# Patient Record
Sex: Female | Born: 2000 | Race: White | Hispanic: No | Marital: Single | Smoking: Never smoker
Health system: Southern US, Community
[De-identification: ages and names within clinical notes are randomized; demographics above are authoritative.]

## PROBLEM LIST (undated history)

## (undated) DIAGNOSIS — F419 Anxiety disorder, unspecified: Secondary | ICD-10-CM

## (undated) DIAGNOSIS — F32A Depression, unspecified: Secondary | ICD-10-CM

## (undated) HISTORY — DX: Depression, unspecified: F32.A

## (undated) HISTORY — DX: Anxiety disorder, unspecified: F41.9

## (undated) HISTORY — PX: OTHER SURGICAL HISTORY: SHX169

---
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
## 2020-01-26 ENCOUNTER — Other Ambulatory Visit: Payer: Self-pay

## 2020-01-26 ENCOUNTER — Encounter: Payer: Self-pay | Admitting: Physician Assistant

## 2020-01-26 ENCOUNTER — Ambulatory Visit (INDEPENDENT_AMBULATORY_CARE_PROVIDER_SITE_OTHER): Payer: 59 | Admitting: Physician Assistant

## 2020-01-26 ENCOUNTER — Ambulatory Visit (HOSPITAL_COMMUNITY)
Admission: RE | Admit: 2020-01-26 | Discharge: 2020-01-26 | Disposition: A | Discharge status: Home / Self Care | Payer: 59 | Source: Ambulatory Visit | Attending: Physician Assistant | Admitting: Physician Assistant

## 2020-01-26 VITALS — BP 90/60 | HR 62 | Temp 98.0°F | Ht 66.5 in | Wt 120.0 lb

## 2020-01-26 DIAGNOSIS — Z23 Encounter for immunization: Secondary | ICD-10-CM | POA: Diagnosis not present

## 2020-01-26 DIAGNOSIS — R1084 Generalized abdominal pain: Secondary | ICD-10-CM | POA: Diagnosis present

## 2020-01-26 LAB — CBC WITH DIFFERENTIAL/PLATELET
Basophils Relative: 0.5 %
Eosinophils Relative: 3.2 %
Hemoglobin: 13.5 g/dL (ref 11.7–15.5)
Lymphs Abs: 1864 cells/uL (ref 850–3900)
MCV: 96.9 fL (ref 80.0–100.0)
Platelets: 289 10*3/uL (ref 140–400)
RDW: 11.9 % (ref 11.0–15.0)
Total Lymphocyte: 32.7 %

## 2020-01-26 LAB — POC URINALSYSI DIPSTICK (AUTOMATED)
Bilirubin, UA: NEGATIVE
Blood, UA: 3
Glucose, UA: NEGATIVE
Ketones, UA: NEGATIVE
Leukocytes, UA: NEGATIVE
Nitrite, UA: NEGATIVE
Protein, UA: POSITIVE — AB
Spec Grav, UA: >=1.03 — AB (ref 1.010–1.025)
Urobilinogen, UA: 0.2 E.U./dL
pH, UA: 6 (ref 5.0–8.0)

## 2020-01-26 LAB — POCT URINE PREGNANCY: Preg Test, Ur: NEGATIVE

## 2020-01-26 MED ORDER — ESOMEPRAZOLE MAGNESIUM 40 MG PO CPDR: 40.0000 mg | DELAYED_RELEASE_CAPSULE | Freq: Every day | ORAL | 3 refills | Status: DC

## 2020-01-26 MED ORDER — ONDANSETRON HCL 4 MG PO TABS: 4.0000 mg | ORAL_TABLET | Freq: Three times a day (TID) | ORAL | 0 refills | Status: DC | PRN

## 2020-01-26 NOTE — Patient Instructions (Signed)
It was great to see you!  We are going to do some tests today to see what's going on. You could be dealing with a few things: gastritis (iritation of your stomach lining), irritable bowel syndrome, stomach ulcer, infection, or several other conditions. I will be in touch with your lab and urine results. We are also getting an ultrasound to see what's going on.  I have sent in a daily medication for you to start -- nexium. This will help reduce the acid content in your stomach and help with any gastritis (irritation of the stomach lining) that you may have.  I have also sent in an as needed medication for you -- zofran -- for nausea.  Please work on alcohol reduction.  Please stick to a bland diet (see below)     Parke Simmers Diet A bland diet consists of foods that are often soft and do not have a lot of fat, fiber, or extra seasonings. Foods without fat, fiber, or seasoning are easier for the body to digest. They are also less likely to irritate your mouth, throat, stomach, and other parts of your digestive system. A bland diet is sometimes called a BRAT diet. What is my plan? Your health care provider or food and nutrition specialist (dietitian) may recommend specific changes to your diet to prevent symptoms or to treat your symptoms. These changes may include:  Eating small meals often.  Cooking food until it is soft enough to chew easily.  Chewing your food well.  Drinking fluids slowly.  Not eating foods that are very spicy, sour, or fatty.  Not eating citrus fruits, such as oranges and grapefruit. What do I need to know about this diet?  Eat a variety of foods from the bland diet food list.  Do not follow a bland diet longer than needed.  Ask your health care provider whether you should take vitamins or supplements. What foods can I eat? Grains  Hot cereals, such as cream of wheat. Rice. Bread, crackers, or tortillas made from refined white flour. Vegetables Canned or  cooked vegetables. Mashed or boiled potatoes. Fruits  Bananas. Applesauce. Other types of cooked or canned fruit with the skin and seeds removed, such as canned peaches or pears. Meats and other proteins  Scrambled eggs. Creamy peanut butter or other nut butters. Lean, well-cooked meats, such as chicken or fish. Tofu. Soups or broths. Dairy Low-fat dairy products, such as milk, cottage cheese, or yogurt. Beverages  Water. Herbal tea. Apple juice. Fats and oils Mild salad dressings. Canola or olive oil. Sweets and desserts Pudding. Custard. Fruit gelatin. Ice cream. The items listed above may not be a complete list of recommended foods and beverages. Contact a dietitian for more options. What foods are not recommended? Grains Whole grain breads and cereals. Vegetables Raw vegetables. Fruits Raw fruits, especially citrus, berries, or dried fruits. Dairy Whole fat dairy foods. Beverages Caffeinated drinks. Alcohol. Seasonings and condiments Strongly flavored seasonings or condiments. Hot sauce. Salsa. Other foods Spicy foods. Fried foods. Sour foods, such as pickled or fermented foods. Foods with high sugar content. Foods high in fiber. The items listed above may not be a complete list of foods and beverages to avoid. Contact a dietitian for more information. Summary  A bland diet consists of foods that are often soft and do not have a lot of fat, fiber, or extra seasonings.  Foods without fat, fiber, or seasoning are easier for the body to digest.  Check with your health care  provider to see how long you should follow this diet plan. It is not meant to be followed for long periods. This information is not intended to replace advice given to you by your health care provider. Make sure you discuss any questions you have with your health care provider. Document Revised: 04/02/2017 Document Reviewed: 04/02/2017 Elsevier Patient Education  2020 [employer].   Take  care,  Jarold Motto PA-C

## 2020-01-26 NOTE — Addendum Note (Signed)
Addended by: Jimmye Norman on: 01/26/2020 10:20 AM   Modules accepted: Orders

## 2020-01-26 NOTE — Progress Notes (Signed)
Cassie [name] is a 19 y.o. female is here to establish care.  I acted as a Neurosurgeon for [employer], PA-C Corky Mull, LPN   History of Present Illness:   Chief Complaint  Patient presents with  . Establish Care  . 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 Would vomit to purge, denies laxative use. Feels like she is no longer suffering from her eating disorder. Pt c/o feeling nauseated all the time and vomiting everyday at least 2 x's a day for the past 2-3 months. She has not appetite and can go days without eating 1-2 days. She has cut meat out of diet. Having lower abdominal pain off and on. Pain is all over. She reports that she is not currently sexually active.  She is vegetarian. She binge drinks on the weekends and states that she went out for her sisters birthday last night, had greasy, fried foods last night from [place] around 3a. Used to use marijuana regularly, around 1 year ago. Denies concerns for cyclical vomiting syndrome while using regularly.  Endorses early satiety.  Denies rectal bleeding.  Has lost about 7 lb since September.  Wt Readings from Last 5 Encounters:  01/26/20 120 lb (54.4 kg)  07/06/14 112 lb 6.4 oz (51 kg) (48 %, Z= -0.04)*  08/08/11 90 lb 8 oz (41.1 kg) (51 %, Z= 0.03)*   * Growth percentiles are based on CDC (Girls, 2-20 Years) data.    Health Maintenance Due  Topic Date Due  . Hepatitis C Screening  Never done  . COVID-19 Vaccine (1) Never done  . CHLAMYDIA SCREENING  Never done  . HIV Screening  Never done  . TETANUS/TDAP  Never done  . INFLUENZA VACCINE  Never done    Past Medical History:  Diagnosis Date  . Anxiety   . Depression      Social History   Tobacco Use  . Smoking status: Never Smoker  . Smokeless tobacco: Never Used  Vaping Use  . Vaping Use: Former  Substance Use Topics  . Alcohol use: Yes    Alcohol/week: 5.0 standard drinks    Types: 5  Glasses of wine per week  . Drug use: Not Currently    Types: Marijuana    History reviewed. No pertinent surgical history.  Family History  Problem Relation Age of Onset  . Suicidality Father 20  . Depression Father   . Early death Father   . Alcohol abuse Father   . Other Maternal Grandfather 44       drown  . Alzheimer's disease Paternal Grandmother   . Suicidality Paternal Grandfather 44    PMHx, SurgHx, SocialHx, FamHx, Medications, and Allergies were reviewed in the Visit Navigator and updated as appropriate.   Patient Active Problem List   Diagnosis Date Noted  . Tension headache 07/06/2014  . Anxiety 07/06/2014    Social History   Tobacco Use  . Smoking status: Never Smoker  . Smokeless tobacco: Never Used  Vaping Use  . Vaping Use: Former  Substance Use Topics  . Alcohol use: Yes    Alcohol/week: 5.0 standard drinks    Types: 5 Glasses of wine per week  . Drug use: Not Currently    Types: Marijuana    Current Medications and Allergies:    Current Outpatient Medications:  .  albuterol (VENTOLIN HFA) 108 (90 Base) MCG/ACT inhaler, Inhale into the lungs., Disp: ,  Rfl:  .  ibuprofen (ADVIL,MOTRIN) 200 MG tablet, Take 200 mg by mouth every 6 (six) hours as needed. Taking two tablets as needed, Disp: , Rfl:  .  esomeprazole (NEXIUM) 40 MG capsule, Take 1 capsule (40 mg total) by mouth daily., Disp: 30 capsule, Rfl: 3 .  ondansetron (ZOFRAN) 4 MG tablet, Take 1 tablet (4 mg total) by mouth every 8 (eight) hours as needed for nausea or vomiting., Disp: 30 tablet, Rfl: 0  No Known Allergies  Review of Systems   ROS Negative unless otherwise specified per HPI.  Vitals:   Vitals:   01/26/20 0841  BP: 90/60  Pulse: 62  Temp: 98 F (36.7 C)  TempSrc: Temporal  SpO2: 98%  Weight: 120 lb (54.4 kg)  Height: 5' 6.5" (1.689 m)     Body mass index is 19.08 kg/m.   Physical Exam:    Physical Exam Vitals and nursing note reviewed.  Constitutional:        General: She is not in acute distress.    Appearance: She is well-developed. She is not ill-appearing or toxic-appearing.  Cardiovascular:     Rate and Rhythm: Normal rate and regular rhythm.     Pulses: Normal pulses.     Heart sounds: Normal heart sounds, S1 normal and S2 normal.     Comments: No LE edema Pulmonary:     Effort: Pulmonary effort is normal.     Breath sounds: Normal breath sounds.  Abdominal:     General: Abdomen is flat. Bowel sounds are normal.     Palpations: Abdomen is soft.     Tenderness: There is abdominal tenderness. There is no right CVA tenderness or left CVA tenderness.  Skin:    General: Skin is warm and dry.  Neurological:     Mental Status: She is alert.     GCS: GCS eye subscore is 4. GCS verbal subscore is 5. GCS motor subscore is 6.  Psychiatric:        Speech: Speech normal.        Behavior: Behavior normal. Behavior is cooperative.    Results for orders placed or performed in visit on 01/26/20  POCT urine pregnancy  Result Value Ref Range   Preg Test, Ur Negative Negative  POCT Urinalysis Dipstick (Automated)  Result Value Ref Range   Color, UA amber    Clarity, UA cloudy    Glucose, UA Negative Negative   Bilirubin, UA Negative    Ketones, UA Negative    Spec Grav, UA >=1.030 (A) 1.010 - 1.025   Blood, UA 3    pH, UA 6.0 5.0 - 8.0   Protein, UA Positive (A) Negative   Urobilinogen, UA 0.2 0.2 or 1.0 E.U./dL   Nitrite, UA Negative    Leukocytes, UA Negative Negative     Assessment and Plan:    Cassie [name] was seen today for establish care and nausea and vomiting.  Diagnoses and all orders for this visit:  Generalized abdominal pain She is quite tender on exam. DDx includes: gastritis, ulcer, IBS, constipation, H pylori, GERD, among others. UA negative (she is currently on her period.) UPT negative. Urine culture pending. Labs as ordered below. Will also order an abdominal u/s. Start daily nexium and as needed  zofran. Encouraged reduction in alcohol and working on bland diet. If labs and imaging unrevealing, will send GI referral. Worsening precautions advised. -     CBC with Differential/Platelet; Future -     Comprehensive metabolic panel; Future -  TSH; Future -     Lipase; Future -     Lipid panel; Future -     POCT urine pregnancy -     POCT Urinalysis Dipstick (Automated) -     Urine Culture; Future -     H. pylori breath test; Future -     US Abdomen Complete; Future -     Lipid panel -     Lipase -     Urine Culture -     TSH -     Comprehensive metabolic panel -     CBC with Differential/Platelet -     H. pylori breath test  Other orders -     esomeprazole (NEXIUM) 40 MG capsule; Take 1 capsule (40 mg total) by mouth daily. -     ondansetron (ZOFRAN) 4 MG tablet; Take 1 tablet (4 mg total) by mouth every 8 (eight) hours as needed for nausea or vomiting.    CMA or LPN served as scribe during this visit. History, Physical, and Plan performed by medical provider. The above documentation has been reviewed and is accurate and complete.   Jarold Motto, PA-C [facility] 01/26/2020  Follow-up: No follow-ups on file.

## 2020-01-27 LAB — CBC WITH DIFFERENTIAL/PLATELET
Absolute Monocytes: 764 cells/uL (ref 200–950)
Basophils Absolute: 29 cells/uL (ref 0–200)
Eosinophils Absolute: 182 cells/uL (ref 15–500)
HCT: 40.4 % (ref 35.0–45.0)
MCH: 32.4 pg (ref 27.0–33.0)
MCHC: 33.4 g/dL (ref 32.0–36.0)
MPV: 9.9 fL (ref 7.5–12.5)
Monocytes Relative: 13.4 %
Neutro Abs: 2861 cells/uL (ref 1500–7800)
Neutrophils Relative %: 50.2 %
RBC: 4.17 10*6/uL (ref 3.80–5.10)
WBC: 5.7 10*3/uL (ref 3.8–10.8)

## 2020-01-27 LAB — LIPID PANEL
Cholesterol: 143 mg/dL (ref <200)
HDL: 61 mg/dL (ref >=50)
LDL Cholesterol (Calc): 59 mg/dL (calc)
Non-HDL Cholesterol (Calc): 82 mg/dL (calc) (ref <130)
Total CHOL/HDL Ratio: 2.3 (calc) (ref <5.0)
Triglycerides: 150 mg/dL — ABNORMAL HIGH (ref <150)

## 2020-01-27 LAB — COMPREHENSIVE METABOLIC PANEL
AG Ratio: 1.8 (calc) (ref 1.0–2.5)
ALT: 10 U/L (ref 6–29)
AST: 13 U/L (ref 10–30)
Albumin: 4.4 g/dL (ref 3.6–5.1)
Alkaline phosphatase (APISO): 40 U/L (ref 31–125)
BUN: 12 mg/dL (ref 7–25)
CO2: 29 mmol/L (ref 20–32)
Calcium: 9.4 mg/dL (ref 8.6–10.2)
Chloride: 107 mmol/L (ref 98–110)
Creat: 0.92 mg/dL (ref 0.50–1.10)
Globulin: 2.4 g/dL (calc) (ref 1.9–3.7)
Glucose, Bld: 59 mg/dL — ABNORMAL LOW (ref 65–99)
Potassium: 4.1 mmol/L (ref 3.5–5.3)
Sodium: 143 mmol/L (ref 135–146)
Total Bilirubin: 0.3 mg/dL (ref 0.2–1.2)
Total Protein: 6.8 g/dL (ref 6.1–8.1)

## 2020-01-27 LAB — URINE CULTURE
MICRO NUMBER:: 11185594
SPECIMEN QUALITY:: ADEQUATE

## 2020-01-27 LAB — H. PYLORI BREATH TEST: H. pylori Breath Test: NOT DETECTED

## 2020-01-27 LAB — LIPASE: Lipase: 51 U/L (ref 7–60)

## 2020-01-27 LAB — TSH: TSH: 1.62 mIU/L

## 2020-01-28 ENCOUNTER — Other Ambulatory Visit: Payer: Self-pay

## 2020-01-28 DIAGNOSIS — R1084 Generalized abdominal pain: Secondary | ICD-10-CM

## 2020-03-07 ENCOUNTER — Encounter: Payer: Self-pay | Admitting: Physician Assistant

## 2020-03-30 ENCOUNTER — Ambulatory Visit (INDEPENDENT_AMBULATORY_CARE_PROVIDER_SITE_OTHER): Payer: 59 | Admitting: Physician Assistant

## 2020-03-30 ENCOUNTER — Encounter: Payer: Self-pay | Admitting: Physician Assistant

## 2020-03-30 VITALS — BP 94/52 | HR 68 | Ht 66.5 in | Wt 123.0 lb

## 2020-03-30 DIAGNOSIS — R112 Nausea with vomiting, unspecified: Secondary | ICD-10-CM

## 2020-03-30 DIAGNOSIS — R630 Anorexia: Secondary | ICD-10-CM

## 2020-03-30 DIAGNOSIS — R1084 Generalized abdominal pain: Secondary | ICD-10-CM | POA: Diagnosis not present

## 2020-03-30 NOTE — Progress Notes (Signed)
Chief Complaint: Abdominal pain, nausea and vomiting  HPI:    Cassie [name] is a 20 year old female with past medical history as listed below including anxiety and depression, who was referred to me by Jarold Motto, PA for a complaint of abdominal pain, nausea and vomiting.      01/26/2020 patient seen in clinic by her PCP for generalized abdominal pain, nausea and vomiting.  Described that she was bulimic from the ages of [age] and would vomit to purge.  At that time did not feel like she was suffering from her eating disorder.  She complained of feeling nauseated all the time and vomiting every day at least 2 times a day for the past 2 to 3 months.  She had no appetite and could go days without eating.  Also described lower abdominal pain off and on.  Describe binge drinking on the weekends.  Also describes using marijuana regularly around a year ago.  UA was negative.  She was started on daily Nexium 40 mg daily and as needed Zofran and an ultrasound was ordered.    01/26/2020 ultrasound of the abdomen with a contracted gallbladder probably due to inadequate fasting.  H. pylori breath testing was negative.  CBC and CMP as well as TSH normal.    Today, the patient presents to clinic and tells me that really she has been feeling better over the past 2 to 3 weeks with regaining her appetite and no further nausea or vomiting.  Occasionally she has some lower abdominal cramping but this is very brief and she is really not concerned about it.  Tells me that prior to this though when she saw her PCP and had the ultrasound everything was "terrible".  Explains that she had a decrease in appetite and did not feel like eating at all for 1 to 2 days.  Described constant nausea with some episodes of vomiting.  Tells me she has a very weak gag reflex which she thinks is related to her history of bulimia.  Also describes that she will have lower abdominal cramping which is kind of constant.  Bowel movements are also  very inconsistent.      Also describes that something "in my stomach pokes out", tells me this occurs in certain positions.  Does describe a history of anxiety and stress and feels like that may have been related to a spike in symptoms recently, but overall is just worried that she did damage to her GI symptom system back when she was bulimic.  Her inconsistent appetite worries her.    Denies fever, chills, weight loss, change in bowel habits, blood in her stool, family history of IBD or symptoms that awaken her from sleep  Past Medical History:  Diagnosis Date  . Anxiety   . Depression     No past surgical history on file.  Current Outpatient Medications  Medication Sig Dispense Refill  . albuterol (VENTOLIN HFA) 108 (90 Base) MCG/ACT inhaler Inhale into the lungs.    Marland Kitchen esomeprazole (NEXIUM) 40 MG capsule Take 1 capsule (40 mg total) by mouth daily. 30 capsule 3  . ibuprofen (ADVIL,MOTRIN) 200 MG tablet Take 200 mg by mouth every 6 (six) hours as needed. Taking two tablets as needed    . ondansetron (ZOFRAN) 4 MG tablet Take 1 tablet (4 mg total) by mouth every 8 (eight) hours as needed for nausea or vomiting. 30 tablet 0   No current facility-administered medications for this visit.    Allergies as  of 03/30/2020  . (No Known Allergies)    Family History  Problem Relation Age of Onset  . Suicidality Father 68  . Depression Father   . Early death Father   . Alcohol abuse Father   . Other Maternal Grandfather 44       drown  . Alzheimer's disease Paternal Grandmother   . Suicidality Paternal Grandfather 45    Social History   Socioeconomic History  . Marital status: Single    Spouse name: Not on file  . Number of children: Not on file  . Years of education: Not on file  . Highest education level: Not on file  Occupational History  . Not on file  Tobacco Use  . Smoking status: Never Smoker  . Smokeless tobacco: Never Used  Vaping Use  . Vaping Use: Former  Substance  and Sexual Activity  . Alcohol use: Yes    Alcohol/week: 5.0 standard drinks    Types: 5 Glasses of wine per week  . Drug use: Not Currently    Types: Marijuana  . Sexual activity: Yes    Birth control/protection: Pill  Other Topics Concern  . Not on file  Social History Narrative   [employer]   Lives with 3 roommmates   Social Determinants of Health   Financial Resource Strain: Not on file  Food Insecurity: Not on file  Transportation Needs: Not on file  Physical Activity: Not on file  Stress: Not on file  Social Connections: Not on file  Intimate Partner Violence: Not on file    Review of Systems:    Constitutional: No weight loss, fever or chills Skin: No rash  Cardiovascular: No chest pain  Respiratory: No SOB  Gastrointestinal: See HPI and otherwise negative Genitourinary: No dysuria  Neurological: No headache, dizziness or syncope Musculoskeletal: No new muscle or joint pain Hematologic: No bleeding  Psychiatric: No history of depression or anxiety   Physical Exam:  Vital signs: BP (!) 94/52   Pulse 68   Ht 5' 6.5" (1.689 m)   Wt 123 lb (55.8 kg)   SpO2 97%   BMI 19.56 kg/m   Constitutional:   Pleasant Caucasian female appears to be in NAD, Well developed, Well nourished, alert and cooperative Head:  Normocephalic and atraumatic. Eyes:   PEERL, EOMI. No icterus. Conjunctiva pink. Ears:  Normal auditory acuity. Neck:  Supple Throat: Oral cavity and pharynx without inflammation, swelling or lesion.  Respiratory: Respirations even and unlabored. Lungs clear to auscultation bilaterally.   No wheezes, crackles, or rhonchi.  Cardiovascular: Normal S1, S2. No MRG. Regular rate and rhythm. No peripheral edema, cyanosis or pallor.  Gastrointestinal:  Soft, nondistended, nontender. No rebound or guarding. Normal bowel sounds. No appreciable masses or hepatomegaly. Rectal:  Not performed.  Msk:  Symmetrical without gross deformities. Without edema, no deformity or joint  abnormality.  Neurologic:  Alert and  oriented x4;  grossly normal neurologically.  Skin:   Dry and intact without significant lesions or rashes. Psychiatric: Demonstrates good judgement and reason without abnormal affect or behaviors.  RELEVANT LABS AND IMAGING: CBC    Component Value Date/Time   WBC 5.7 01/26/2020 0912   RBC 4.17 01/26/2020 0912   HGB 13.5 01/26/2020 0912   HCT 40.4 01/26/2020 0912   PLT 289 01/26/2020 0912   MCV 96.9 01/26/2020 0912   MCH 32.4 01/26/2020 0912   MCHC 33.4 01/26/2020 0912   RDW 11.9 01/26/2020 0912   LYMPHSABS 1,864 01/26/2020 0912   EOSABS 182  01/26/2020 0912   BASOSABS 29 01/26/2020 0912    CMP     Component Value Date/Time   NA 143 01/26/2020 0912   K 4.1 01/26/2020 0912   CL 107 01/26/2020 0912   CO2 29 01/26/2020 0912   GLUCOSE 59 (L) 01/26/2020 0912   BUN 12 01/26/2020 0912   CREATININE 0.92 01/26/2020 0912   CALCIUM 9.4 01/26/2020 0912   PROT 6.8 01/26/2020 0912   AST 13 01/26/2020 0912   ALT 10 01/26/2020 0912   BILITOT 0.3 01/26/2020 0912    Assessment: 1.  Lower abdominal pain: Occasional lower abdominal pain; consider IBS 2.  Nausea and vomiting: Episodes of nausea and vomiting, patient reports easy gag reflex, history of bulimia likely contributing 3.  Decrease in appetite: With above 4.  History of bulimia: From the ages of [age]  Plan: 1.  Discussed with patient that the best way to make sure that nothing is organically going wrong in her GI system would be to do an EGD and colonoscopy.  We discussed these in detail and I answered her questions.  Patient is currently feeling better over the past couple of weeks and would really like to wait on these procedures but will call and let us know if she changes her mind. 2.  For now discussed that the symptoms sound most like irritable bowel syndrome +/- functional dyspepsia, we discussed how this is linked to her emotions and likely stress/anxiety. 3.  Patient will call and  let us know if she has further issues in the future.  She does live in [place] but tells me that she travels back occasionally to see her parents.  Assigned to Dr. Orvan Falconer this morning  Hyacinth Meeker, PA-C [facility] 03/30/2020, 9:44 AM  Cc: Jarold Motto, [place]

## 2020-03-30 NOTE — Patient Instructions (Signed)
If you are age 21 or older, your body mass index should be between 23-30. Your Body mass index is 19.56 kg/m. If this is out of the aforementioned range listed, please consider follow up with your Primary Care Provider.  If you are age 28 or younger, your body mass index should be between 19-25. Your Body mass index is 19.56 kg/m. If this is out of the aformentioned range listed, please consider follow up with your Primary Care Provider.   Follow up as needed.  Thank you for choosing me and  Gastroenterology.  Hyacinth Meeker, PA-C

## 2020-04-07 NOTE — Progress Notes (Signed)
Reviewed.  Archana Eckman L. Brynnleigh Mcelwee, MD, MPH  

## 2020-05-22 ENCOUNTER — Telehealth: Payer: Self-pay

## 2020-05-22 DIAGNOSIS — Z01419 Encounter for gynecological examination (general) (routine) without abnormal findings: Secondary | ICD-10-CM

## 2020-05-22 DIAGNOSIS — F32A Depression, unspecified: Secondary | ICD-10-CM | POA: Insufficient documentation

## 2020-05-22 NOTE — Telephone Encounter (Signed)
Pt is wanting a referral to an OBGYN at [facility] in [place]

## 2020-05-22 NOTE — Telephone Encounter (Signed)
Shanda Bumps, need to know provider name?

## 2020-05-22 NOTE — Telephone Encounter (Signed)
Referral has been placed. 

## 2020-05-22 NOTE — Telephone Encounter (Signed)
There was no specific provider requested. Just requesting that location

## 2020-11-15 ENCOUNTER — Other Ambulatory Visit: Payer: Self-pay

## 2020-11-15 ENCOUNTER — Encounter: Payer: Self-pay | Admitting: Physician Assistant

## 2020-11-15 ENCOUNTER — Ambulatory Visit (INDEPENDENT_AMBULATORY_CARE_PROVIDER_SITE_OTHER): Payer: 59 | Admitting: Physician Assistant

## 2020-11-15 VITALS — BP 90/60 | HR 60 | Temp 98.0°F | Ht 66.5 in | Wt 136.0 lb

## 2020-11-15 DIAGNOSIS — F32A Depression, unspecified: Secondary | ICD-10-CM | POA: Diagnosis not present

## 2020-11-15 DIAGNOSIS — R41840 Attention and concentration deficit: Secondary | ICD-10-CM

## 2020-11-15 DIAGNOSIS — R5383 Other fatigue: Secondary | ICD-10-CM | POA: Diagnosis not present

## 2020-11-15 DIAGNOSIS — R6881 Early satiety: Secondary | ICD-10-CM

## 2020-11-15 DIAGNOSIS — F419 Anxiety disorder, unspecified: Secondary | ICD-10-CM | POA: Diagnosis not present

## 2020-11-15 LAB — COMPREHENSIVE METABOLIC PANEL
ALT: 11 U/L (ref 0–35)
AST: 20 U/L (ref 0–37)
Albumin: 4.8 g/dL (ref 3.5–5.2)
Alkaline Phosphatase: 47 U/L (ref 39–117)
BUN: 19 mg/dL (ref 6–23)
CO2: 28 mEq/L (ref 19–32)
Calcium: 10.2 mg/dL (ref 8.4–10.5)
Chloride: 102 mEq/L (ref 96–112)
Creatinine, Ser: 1.04 mg/dL (ref 0.40–1.20)
GFR: 77.06 mL/min (ref >60.00)
Glucose, Bld: 81 mg/dL (ref 70–99)
Potassium: 4.4 mEq/L (ref 3.5–5.1)
Sodium: 139 mEq/L (ref 135–145)
Total Bilirubin: 0.7 mg/dL (ref 0.2–1.2)
Total Protein: 7.8 g/dL (ref 6.0–8.3)

## 2020-11-15 LAB — POC URINALSYSI DIPSTICK (AUTOMATED)
Bilirubin, UA: NEGATIVE
Blood, UA: POSITIVE
Glucose, UA: NEGATIVE
Ketones, UA: NEGATIVE
Leukocytes, UA: NEGATIVE
Nitrite, UA: NEGATIVE
Protein, UA: POSITIVE — AB
Spec Grav, UA: >=1.03 — AB (ref 1.010–1.025)
Urobilinogen, UA: 0.2 E.U./dL
pH, UA: 6 (ref 5.0–8.0)

## 2020-11-15 LAB — CBC WITH DIFFERENTIAL/PLATELET
Basophils Absolute: 0 10*3/uL (ref 0.0–0.1)
Basophils Relative: 0.3 % (ref 0.0–3.0)
Eosinophils Absolute: 0.1 10*3/uL (ref 0.0–0.7)
Eosinophils Relative: 1.3 % (ref 0.0–5.0)
HCT: 42.1 % (ref 36.0–46.0)
Hemoglobin: 14.1 g/dL (ref 12.0–15.0)
Lymphocytes Relative: 34.7 % (ref 12.0–46.0)
Lymphs Abs: 1.8 10*3/uL (ref 0.7–4.0)
MCHC: 33.5 g/dL (ref 30.0–36.0)
MCV: 94.3 fl (ref 78.0–100.0)
Monocytes Absolute: 0.5 10*3/uL (ref 0.1–1.0)
Monocytes Relative: 9.5 % (ref 3.0–12.0)
Neutro Abs: 2.9 10*3/uL (ref 1.4–7.7)
Neutrophils Relative %: 54.2 % (ref 43.0–77.0)
Platelets: 262 10*3/uL (ref 150.0–400.0)
RBC: 4.46 Mil/uL (ref 3.87–5.11)
RDW: 12.6 % (ref 11.5–15.5)
WBC: 5.3 10*3/uL (ref 4.0–10.5)

## 2020-11-15 LAB — VITAMIN D 25 HYDROXY (VIT D DEFICIENCY, FRACTURES): VITD: 46 ng/mL (ref 30.00–100.00)

## 2020-11-15 LAB — POCT URINE PREGNANCY: Preg Test, Ur: NEGATIVE

## 2020-11-15 LAB — B12 AND FOLATE PANEL
Folate: 24 ng/mL (ref >5.9)
Vitamin B-12: 286 pg/mL (ref 211–911)

## 2020-11-15 NOTE — Patient Instructions (Signed)
It was great to see you!  Please proceed with ADHD evaluation with the counselor  I will update your blood work today  If your symptoms are not improved, in regards to appetite, please come back and see me  Take care,  Jarold Motto PA-C

## 2020-11-15 NOTE — Progress Notes (Signed)
Cassie [name] is a 20 y.o. female here for a new problem.   History of Present Illness:   Chief Complaint  Patient presents with   Labs    Pt says that Ps[facility] sent order for anemia, and to check any vit deficiencies.    Problems Focusing    She states that she has 3 online classes.    HPI  Fatigue Has been dealing with decreased energy for a few months.  She has been seeing a psychiatrist for medication management of anxiety and depression.  She states that she is also being evaluated for possible bipolar disorder II, however no specific diagnosis has been made at this time.  She was put on Zoloft And this made her sleepy and more depressed.  She then states she was put on another medication that she cannot recall and also had a negative side effect from this medication.  As of 4 days ago she has been on Wellbutrin 150 mg daily. Having some irritability and insomnia with it. Taking at 10 am daily.  She reports that she has heavy periods that last about 7 days and the first 4 days are heavy.  She is getting about 8 hours of sleep per night.  She thinks that her uncontrolled anxiety and depression could be contributing to her fatigue.  She does admit that she does not eat well-balanced meals, she has issues with trying to eat frequently throughout the day.  She has early satiety.  Does have a history of bulimia, but states that she has not been actively purging and denies any current binging.  She has gained about 13 pounds since I saw her in January of this year.  Denies night sweats, shortness of breath, lower leg swelling, rectal bleeding.  Attention deficit She has some concerns that she may have ADHD.  She got a referral from her psychiatrist to see a psychologist but she is concerned because the weight is greater than 3 months to see this provider.  She states that her ADHD has caused her to not be able to focus during school, and she has had to drop out twice.  She is currently enrolled at  [employer].   Past Medical History:  Diagnosis Date   Anxiety    Depression      Social History   Tobacco Use   Smoking status: Never   Smokeless tobacco: Never  Vaping Use   Vaping Use: Former  Substance Use Topics   Alcohol use: Yes    Alcohol/week: 5.0 standard drinks    Types: 5 Glasses of wine per week   Drug use: Not Currently    Types: Marijuana    Past Surgical History:  Procedure Laterality Date   none      Family History  Problem Relation Age of Onset   Suicidality Father 23   Depression Father    Early death Father    Alcohol abuse Father    Other Maternal Grandfather 11       drown   Alzheimer's disease Paternal Grandmother    Suicidality Paternal Grandfather 6    No Known Allergies  Current Medications:   Current Outpatient Medications:    buPROPion (WELLBUTRIN XL) 150 MG 24 hr tablet, Take 150 mg by mouth every morning., Disp: , Rfl:    ibuprofen (ADVIL,MOTRIN) 200 MG tablet, Take 200 mg by mouth every 6 (six) hours as needed. Taking two tablets as needed, Disp: , Rfl:    Review of Systems:  ROS Negative unless otherwise specified per HPI.  Vitals:   Vitals:   11/15/20 0850  BP: 90/60  Pulse: 60  Temp: 98 F (36.7 C)  TempSrc: Temporal  SpO2: 99%  Weight: 136 lb (61.7 kg)  Height: 5' 6.5" (1.689 m)     Body mass index is 21.62 kg/m.  Physical Exam:   Physical Exam Vitals and nursing note reviewed.  Constitutional:      General: She is not in acute distress.    Appearance: She is well-developed. She is not ill-appearing or toxic-appearing.  Cardiovascular:     Rate and Rhythm: Normal rate and regular rhythm.     Pulses: Normal pulses.     Heart sounds: Normal heart sounds, S1 normal and S2 normal.     Comments: No LE edema Pulmonary:     Effort: Pulmonary effort is normal.     Breath sounds: Normal breath sounds.  Abdominal:     General: Abdomen is flat. Bowel sounds are normal.     Palpations: Abdomen is soft.      Tenderness: There is no abdominal tenderness.  Skin:    General: Skin is warm and dry.  Neurological:     Mental Status: She is alert.     GCS: GCS eye subscore is 4. GCS verbal subscore is 5. GCS motor subscore is 6.  Psychiatric:        Speech: Speech normal.        Behavior: Behavior normal. Behavior is cooperative.   Results for orders placed or performed in visit on 11/15/20  POCT Urinalysis Dipstick (Automated)  Result Value Ref Range   Color, UA yellow    Clarity, UA cloudy    Glucose, UA Negative Negative   Bilirubin, UA neg    Ketones, UA neg    Spec Grav, UA >=1.030 (A) 1.010 - 1.025   Blood, UA positive    pH, UA 6.0 5.0 - 8.0   Protein, UA Positive (A) Negative   Urobilinogen, UA 0.2 0.2 or 1.0 E.U./dL   Nitrite, UA neg    Leukocytes, UA Negative Negative  POCT urine pregnancy  Result Value Ref Range   Preg Test, Ur Negative Negative       Assessment and Plan:   1. Fatigue, unspecified type Suspect multifactorial We will update blood work and obtain urine pregnancy test to rule out organic causes of this I do suspect that her uncontrolled anxiety and depression is playing a role with this Continue to work on drinking fluids regularly and eating well-balanced meals as able Follow-up if symptoms persist  We will plan to fax her results to her psychiatrist: Jocelyn Lamer PA FAX: [phone]   2. Anxiety Uncontrolled, management per psychiatry.  3. Depression, unspecified depression type Uncontrolled, management per psychiatry. I discussed with patient that if they develop any SI, to tell someone immediately and seek medical attention.  4. Early satiety We will update blood work I do think this could be possibly related to her anxiety and also medication related. Continue to work on small frequent meals as able with plenty of hydration May potentially need to refer to GI for further evaluation if symptoms persist or worsen  5. Attention deficit I  discussed with patient that I cannot prescribe medication without proper diagnosis Encourage patient to follow-up with psych colleges that she was referred to her psychiatrist  CMA or LPN served as scribe during this visit. History, Physical, and Plan performed by medical provider. The above documentation has  been reviewed and is accurate and complete.  Jarold Motto, PA-C

## 2020-11-16 LAB — IRON,TIBC AND FERRITIN PANEL
%SAT: 29 % (calc) (ref 16–45)
Ferritin: 41 ng/mL (ref 16–154)
Iron: 95 ug/dL (ref 40–190)
TIBC: 327 mcg/dL (calc) (ref 250–450)

## 2020-11-28 ENCOUNTER — Telehealth: Payer: Self-pay

## 2020-11-28 DIAGNOSIS — F988 Other specified behavioral and emotional disorders with onset usually occurring in childhood and adolescence: Secondary | ICD-10-CM

## 2020-11-28 NOTE — Telephone Encounter (Signed)
Spoke to pt told her referral has been placed for [facility]. Pt verbalized understanding.

## 2020-11-28 NOTE — Telephone Encounter (Signed)
Patient is requesting Sam to enter a referral for ADD testing to [facility].    Please follow up in regard.

## 2021-02-01 ENCOUNTER — Telehealth (INDEPENDENT_AMBULATORY_CARE_PROVIDER_SITE_OTHER): Payer: 59 | Admitting: Family

## 2021-02-01 ENCOUNTER — Other Ambulatory Visit: Payer: Self-pay

## 2021-02-01 DIAGNOSIS — R41840 Attention and concentration deficit: Secondary | ICD-10-CM

## 2021-02-01 DIAGNOSIS — F324 Major depressive disorder, single episode, in partial remission: Secondary | ICD-10-CM

## 2021-02-01 DIAGNOSIS — F419 Anxiety disorder, unspecified: Secondary | ICD-10-CM | POA: Diagnosis not present

## 2021-02-01 MED ORDER — BUPROPION HCL ER (XL) 150 MG PO TB24: 150.0000 mg | ORAL_TABLET | Freq: Every morning | ORAL | 2 refills | Status: DC
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
## 2021-02-02 ENCOUNTER — Encounter: Payer: Self-pay | Admitting: Family

## 2021-02-19 ENCOUNTER — Encounter: Payer: Self-pay | Admitting: Family

## 2021-03-01 ENCOUNTER — Ambulatory Visit: Payer: 59 | Admitting: Family

## 2021-03-01 ENCOUNTER — Telehealth: Payer: Self-pay | Admitting: Family

## 2021-03-07 ENCOUNTER — Other Ambulatory Visit: Payer: Self-pay

## 2021-03-07 NOTE — Telephone Encounter (Signed)
Start off with taking half and may go up to full tab if any issues. Call in 2 weeks with update

## 2021-03-08 MED ORDER — DYANAVEL XR 5 MG PO CHER: 5.0000 mg | CHEWABLE_EXTENDED_RELEASE_TABLET | Freq: Every day | ORAL | 0 refills | Status: DC

## 2021-03-08 NOTE — Telephone Encounter (Signed)
Dyanavel XR 5 mg tablets daily, # 30 with no RF's.RX for above e-scribed and sent to pharmacy on record  [facility] [phone] [place] [facility], [place] - [address] [place] [address] Phone: [phone] Fax: [phone]

## 2021-03-09 ENCOUNTER — Telehealth: Payer: Self-pay

## 2021-03-14 ENCOUNTER — Other Ambulatory Visit: Payer: Self-pay

## 2021-03-14 MED ORDER — DYANAVEL XR 5 MG PO CHER: 5.0000 mg | CHEWABLE_EXTENDED_RELEASE_TABLET | Freq: Every day | ORAL | 0 refills | Status: DC

## 2021-03-14 NOTE — Telephone Encounter (Signed)
[facility] [address] [facility] around about when Dyanavel 5mg  will be in stock. [facility]

## 2021-03-14 NOTE — Telephone Encounter (Signed)
Dyanavel XR 5 mg daily, # 30 with no RF's.RX for above e-scribed and sent to pharmacy on record  [id number] - [place], [place] - [address] AT [facility] [address] [place] [address] Phone: [phone] Fax: [phone]

## 2021-03-20 DIAGNOSIS — R69 Illness, unspecified: Secondary | ICD-10-CM | POA: Diagnosis not present

## 2021-03-20 DIAGNOSIS — F411 Generalized anxiety disorder: Secondary | ICD-10-CM | POA: Diagnosis not present

## 2021-03-22 ENCOUNTER — Telehealth: Payer: Self-pay | Admitting: Family

## 2021-03-22 NOTE — Telephone Encounter (Signed)
error 

## 2021-03-29 ENCOUNTER — Ambulatory Visit: Payer: Self-pay | Admitting: Family

## 2021-04-05 ENCOUNTER — Other Ambulatory Visit: Payer: Self-pay

## 2021-04-05 NOTE — Telephone Encounter (Signed)
Patient called in stating that Dyanavel is working but feels she needs a higher dosage. Sending in 10mg 

## 2021-04-06 ENCOUNTER — Telehealth: Payer: Self-pay

## 2021-04-06 MED ORDER — DYANAVEL XR 10 MG PO CHER: 10.0000 mg | CHEWABLE_EXTENDED_RELEASE_TABLET | Freq: Every day | ORAL | 0 refills | Status: DC

## 2021-04-06 NOTE — Telephone Encounter (Signed)
RX for above e-scribed and sent to pharmacy on record  [id number] - [place], [place] - [address] [place] [address] Phone: [phone] Fax: [phone]    

## 2021-04-16 DIAGNOSIS — R52 Pain, unspecified: Secondary | ICD-10-CM | POA: Diagnosis not present

## 2021-04-16 DIAGNOSIS — Z3009 Encounter for other general counseling and advice on contraception: Secondary | ICD-10-CM | POA: Diagnosis not present

## 2021-04-16 DIAGNOSIS — Z01419 Encounter for gynecological examination (general) (routine) without abnormal findings: Secondary | ICD-10-CM | POA: Diagnosis not present

## 2021-04-16 DIAGNOSIS — Z1389 Encounter for screening for other disorder: Secondary | ICD-10-CM | POA: Diagnosis not present

## 2021-04-16 DIAGNOSIS — R69 Illness, unspecified: Secondary | ICD-10-CM | POA: Diagnosis not present

## 2021-04-16 DIAGNOSIS — Z113 Encounter for screening for infections with a predominantly sexual mode of transmission: Secondary | ICD-10-CM | POA: Diagnosis not present

## 2021-04-16 DIAGNOSIS — Z124 Encounter for screening for malignant neoplasm of cervix: Secondary | ICD-10-CM | POA: Diagnosis not present

## 2021-04-25 ENCOUNTER — Other Ambulatory Visit: Payer: Self-pay

## 2021-04-25 MED ORDER — DYANAVEL XR 20 MG PO CHER: 20.0000 mg | CHEWABLE_EXTENDED_RELEASE_TABLET | Freq: Every day | ORAL | 0 refills | Status: DC

## 2021-04-25 NOTE — Telephone Encounter (Signed)
Dyanavel XR 20 mg daily, # 30 with no RF's.RX for above e-scribed and sent to pharmacy on record  [facility] [phone] [facility], [place] - [address] [place] [address] Phone: [phone] Fax: [phone]

## 2021-04-26 ENCOUNTER — Telehealth: Payer: Self-pay

## 2021-05-01 ENCOUNTER — Encounter: Payer: Self-pay | Admitting: Family

## 2021-05-01 ENCOUNTER — Other Ambulatory Visit: Payer: Self-pay

## 2021-05-01 ENCOUNTER — Ambulatory Visit (INDEPENDENT_AMBULATORY_CARE_PROVIDER_SITE_OTHER): Payer: 59 | Admitting: Family

## 2021-05-01 VITALS — BP 112/64 | HR 72 | Ht 66.54 in | Wt 123.4 lb

## 2021-05-01 DIAGNOSIS — R4589 Other symptoms and signs involving emotional state: Secondary | ICD-10-CM

## 2021-05-01 DIAGNOSIS — Z719 Counseling, unspecified: Secondary | ICD-10-CM

## 2021-05-01 DIAGNOSIS — Z1339 Encounter for screening examination for other mental health and behavioral disorders: Secondary | ICD-10-CM

## 2021-05-01 DIAGNOSIS — R413 Other amnesia: Secondary | ICD-10-CM

## 2021-05-01 DIAGNOSIS — F419 Anxiety disorder, unspecified: Secondary | ICD-10-CM | POA: Diagnosis not present

## 2021-05-01 DIAGNOSIS — Z559 Problems related to education and literacy, unspecified: Secondary | ICD-10-CM

## 2021-05-01 DIAGNOSIS — R4189 Other symptoms and signs involving cognitive functions and awareness: Secondary | ICD-10-CM

## 2021-05-01 DIAGNOSIS — R41840 Attention and concentration deficit: Secondary | ICD-10-CM

## 2021-05-01 DIAGNOSIS — Z79899 Other long term (current) drug therapy: Secondary | ICD-10-CM

## 2021-05-01 DIAGNOSIS — F819 Developmental disorder of scholastic skills, unspecified: Secondary | ICD-10-CM | POA: Diagnosis not present

## 2021-05-01 DIAGNOSIS — Z7189 Other specified counseling: Secondary | ICD-10-CM

## 2021-05-01 DIAGNOSIS — R41844 Frontal lobe and executive function deficit: Secondary | ICD-10-CM

## 2021-05-01 MED ORDER — BUPROPION HCL ER (XL) 150 MG PO TB24: 150.0000 mg | ORAL_TABLET | Freq: Every morning | ORAL | 2 refills | Status: DC

## 2021-05-01 NOTE — Progress Notes (Deleted)
[facility] [address] [place] [address] Dept: [phone] Dept Fax: [phone] Loc: [phone] Loc Fax: [phone]  Neurodevelopmental Evaluation  Patient ID: Cassie [name], female  DOB: 12-15-2000, 21 y.o.  MRN: [id number]  DATE: 05/02/21  This is the first pediatric Neurodevelopmental Evaluation.  Patient is Polite and cooperative and present by herself.   The Intake interview was completed on 02/01/2021 .  Please review Epic for pertinent histories and review of Intake information.   The reason for the evaluation is to address concerns for Attention Deficit Hyperactivity Disorder (ADHD) or additional learning challenges.   Neurodevelopmental Examination:  Cassie [name] is a young adult, caucasian female who is alert, active and in no acute distress. She of average height with smaller build with no significant dysmorphic features noted.  Growth Parameters: Height: 66.54 inches Weight: 123.4 lb  OFC: 55.5 cm   BP: 112/64   General Exam: Physical Exam Vitals reviewed.  Constitutional:      Appearance: Normal appearance. She is well-developed.  HENT:     Head: Normocephalic and atraumatic.     Right Ear: Tympanic membrane, ear canal and external ear normal.     Left Ear: Tympanic membrane, ear canal and external ear normal.     Nose: Nose normal.     Mouth/Throat:     Mouth: Mucous membranes are moist.  Eyes:     Extraocular Movements: Extraocular movements intact.     Conjunctiva/sclera: Conjunctivae normal.     Pupils: Pupils are equal, round, and reactive to light.  Cardiovascular:     Rate and Rhythm: Normal rate and regular rhythm.     Pulses: Normal pulses.     Heart sounds: Normal heart sounds.  Pulmonary:     Effort: Pulmonary effort is normal.     Breath sounds: Normal breath sounds.  Abdominal:     General:  Bowel sounds are normal.     Palpations: Abdomen is soft.  Musculoskeletal:        General: Normal range of motion.     Cervical back: Normal range of motion and neck supple.  Skin:    General: Skin is warm and dry.     Capillary Refill: Capillary refill takes less than 2 seconds.  Neurological:     General: No focal deficit present.     Mental Status: She is alert and oriented to person, place, and time. Mental status is at baseline.     Deep Tendon Reflexes: Reflexes are normal and symmetric.  Psychiatric:        Mood and Affect: Mood normal.        Behavior: Behavior normal.        Thought Content: Thought content normal.        Judgment: Judgment normal.   Neurological: Language Sample: appropriate for age Oriented: oriented to time, place, and person Cranial Nerves: normal  Neuromuscular: Motor: muscle mass: normal  Strength: normal  Tone: normal Deep Tendon Reflexes: 2+ and symmetric Overflow/Reduplicative Beats: None Clonus: without  Babinskis: negative Primitive Reflex Profile: n/a  Cerebellar: no tremors noted  Sensory Exam: Fine touch: intact  Vibratory: intact  Gross Motor Skills: Walks, Runs, Up on Tip Toe, Jumps 24", Stands on 1 Foot (R), Stands on 1 Foot (L), Tandem (F), Tandem (R), and Skips Orthotic Devices: None  Developmental Examination: Developmental/Cognitive Testing: Gesell Figures: 12-year level, Goodenough Draw A Person: 13+-year level, Auditory Digits  D/F: 2 1/2-year level=3/3, 3-year level=3/3, 4 1/2-year level=3/3, 7-year level=3/3, 10-year level=2/3, Adult level=0/3, Auditory Digits D/R: 7-year level=3/3, 9-year level=1/3, 12-year level=0/3, Adult level=0/3, Visual/Oral D/F: adult level, Visual/Oral D/R: adult level, Auditory Sentences: 5-year, 44-month level, Reading: Oceanographer) Single Words: Kindergarten through 8th grade level=20/20, 9-12th grade level=18/20, Reading: Grade Level: high school level, and Other Comments: Right handed with a chuck pencil  grip. No difficulites with written output for the [employer] or sentence structure. Thoughts were clear and concise. Cassie [name] was able to produce appropriate sentences with capitalization and punctuation. She struggled to remember things such as audible objects, numbers, repeating back a sentence, and sequencing information. Cassie [name] asked for items to be repeated several times, which caused some visible annoyance. Cassie [name] did not struggle with copying pictures and had no difficulty with re-creating three-dimensional objects. She did improve with her memory skills when there was also visual input. Cassie [name] did struggle with attention often asking for items to be repeated, which caused an increase in her anxiety. She also had difficulty with some of the short term memory functions and auditory processing functions. Some display of fidgeting throughout the evaluation noted when Cassie [name] was required to focus or had difficulty with performance, which caused more anxiety. Cassie [name] would benefit from continued treatment of her anxiety and depression along with effective treatment of her attention issues.    Diagnoses:    ICD-10-CM   1. ADHD (attention deficit hyperactivity disorder) evaluation  Z13.39     2. Anxiety  F41.9     3. Depressed affect  R45.89     4. Learning difficulty  F81.9     5. Has difficulties with academic performance  Z55.9     6. Difficulty processing information  R41.89     7. Attention and concentration deficit  R41.840     8. Medication management  Z79.899     9. Patient counseled  Z71.9     10. Goals of care, counseling/discussion  Z71.89     11. Poor short term memory  R41.3     12. Executive function deficit  R41.844      Assessment:   Recommendations: ***

## 2021-05-02 ENCOUNTER — Telehealth: Payer: Self-pay

## 2021-05-02 ENCOUNTER — Encounter: Payer: Self-pay | Admitting: Family

## 2021-05-02 NOTE — Progress Notes (Signed)
[facility] [address] [place] [address] Dept: [phone] Dept Fax: [phone] Loc: [phone] Loc Fax: [phone]  Neurodevelopmental Evaluation  Patient ID: Cassie [name], female  DOB: May 22, 2000, 21 y.o.  MRN: [id number]  DATE: 05/01/21  This is the first pediatric Neurodevelopmental Evaluation.  Patient is Polite and cooperative and present by herself.   The Intake interview was completed on 02/01/2021 .  Please review Epic for pertinent histories and review of Intake information.   The reason for the evaluation is to address concerns for Attention Deficit Hyperactivity Disorder (ADHD) or additional learning challenges.   Neurodevelopmental Examination: Cassie [name] is a young adult female who was alert, active and in no acute distress. She is of average height with smaller frame with no dysmorphic features noted.  Growth Parameters: Height: 66.54 inches Weight: 123.4 lb   OFC: 55.5 cm   BP: 112/64   General Exam: Physical Exam Vitals reviewed.  Constitutional:      Appearance: Normal appearance. She is well-developed.  HENT:     Head: Normocephalic and atraumatic.     Right Ear: Tympanic membrane, ear canal and external ear normal.     Left Ear: Tympanic membrane, ear canal and external ear normal.     Nose: Nose normal.     Mouth/Throat:     Mouth: Mucous membranes are moist.  Eyes:     Extraocular Movements: Extraocular movements intact.     Conjunctiva/sclera: Conjunctivae normal.     Pupils: Pupils are equal, round, and reactive to light.  Cardiovascular:     Rate and Rhythm: Normal rate and regular rhythm.     Pulses: Normal pulses.     Heart sounds: Normal heart sounds.  Pulmonary:     Effort: Pulmonary effort is normal.     Breath sounds: Normal breath sounds.  Abdominal:     General: Bowel sounds are  normal.     Palpations: Abdomen is soft.  Musculoskeletal:        General: Normal range of motion.     Cervical back: Normal range of motion and neck supple.  Skin:    General: Skin is warm and dry.     Capillary Refill: Capillary refill takes less than 2 seconds.  Neurological:     General: No focal deficit present.     Mental Status: She is alert and oriented to person, place, and time. Mental status is at baseline.     Deep Tendon Reflexes: Reflexes are normal and symmetric.  Psychiatric:        Mood and Affect: Mood normal.        Behavior: Behavior normal.        Thought Content: Thought content normal.        Judgment: Judgment normal.   Neurological: Language Sample: appropriate for age Oriented: oriented to time, place, and person Cranial Nerves: normal  Neuromuscular: Motor: muscle mass: normal  Strength: normal  Tone: normal Deep Tendon Reflexes: 2+ and symmetric Overflow/Reduplicative Beats: None Clonus: without  Babinskis: negative Primitive Reflex Profile: n/a  Cerebellar: no tremors noted  Sensory Exam: Fine touch: intact  Vibratory: intact  Gross Motor Skills: Walks, Runs, Up on Tip Toe, Jumps 24", Stands on 1 Foot (R), Stands on 1 Foot (L), Tandem (F), Tandem (R), and Skips Orthotic Devices: None  Developmental Examination: Developmental/Cognitive Testing: Gesell Figures: 12-year level, Goodenough Draw A Person: 13+ year level, Auditory Digits  D/F: 2 1/2-year level=3/3, 3-year level=3/3, 4 1/2-year level=3/3, 7-year level=3/3, 10-year level=2/3, Adult level=0/3, Auditory Digits D/R: 7-year level=3/3, 9-year level=1/3, 12-year level=0/3, Adult level=0/3 Visual/Oral D/F: adult level, Visual/Oral D/R: adult level, Auditory Sentences: 5-year, 33-month level with no omissions or substitutions, Reading: Oceanographer) Single Words: Kindergarten through 8th grade level=20/20, 9-12th grade level=18/20, Grade Level: high school level, Reading: Paragraphs/Decoding: 100% with 25%  comprehension when she read the information versus 60% comprehension when the information was read to her by the provider, and Other Comments: Right handed  with a chuck pencil grip. No difficulites with written output for the [employer] or sentence structure. Thoughts were clear and concise. Cassie [name] was able to produce appropriate sentences with capitalization and punctuation. She struggled to remember things such as audible objects, numbers, repeating back a sentence, and sequencing information. Cassie [name] asked for items to be repeated several times, which caused some visible annoyance. Cassie [name] did not struggle with copying pictures and had no difficulty with re-creating three dimensional objects. She did improve with her memory skills when there was also visual input. Cassie [name] did struggle with attention often asking for items to be repeated, which caused an increase in her anxiety. She also had difficulty with some of the short term memory functions and auditory processing functions. Amahia would benefit from continued treatment of her anxiety and depression along with effective treatment of her attention issues.   Diagnoses:    ICD-10-CM   1. ADHD (attention deficit hyperactivity disorder) evaluation  Z13.39     2. Anxiety  F41.9     3. Depressed affect  R45.89     4. Learning difficulty  F81.9     5. Has difficulties with academic performance  Z55.9     6. Difficulty processing information  R41.89     7. Attention and concentration deficit  R41.840     8. Medication management  Z79.899     9. Patient counseled  Z71.9     10. Goals of care, counseling/discussion  Z71.89     11. Poor short term memory  R41.3     12. Executive function deficit  R41.844      Assessment: Cassie [name] is a 21 year old female with a history of anxiety, depression and difficulty with short-term memory. She has displayed problems with attention, processing information and executive functioning. She is  currently prescribed Wellbutrin XL 150 mg daily for treatment of her anxiety and depression with reported efficacy and no side effects. Still lacking executive functioning abilities due to inattention at home , school, and at work environments. Appropriate medication for management of her current symptoms will be reviewed with patient along with continued medication management for her anxiety and depression.   Recommendations:  Discussed today's evaluation with Cassie [name] and her ongoing concerns with attention issues and academic difficulties.   Reviewed rating scales for attention, anxiety and depression.   Concerns for daily performance with home, school, and work address with patient.    History of difficulties at school and academic performance along with ongoing issues with social interactions.   Discussed pharmacogenetic testing with patient related to appropriate medication management. Reviewed appropriate symptom control and current medication regiment.    Changes for medication adjustment over the next few weeks as follows: Increase Cassie [name] XR 10 mg chews to 20 mg daily due to needed symptom control in the evening time.   Had started medication prior to the evaluation with good results from Cassie [name] XR 10 mg chew with efficacy for during  the day but not for work in the evening time.   Wellbutrin XL 150 mg with no changes today due to continued symptom control.  Can email or call for updates over the next few weeks: [email or web address]   Counseled medication pharmacokinetics, options, dosage, administration, desired effects, and possible side effects.   Wellbutrin XL 150 mg daily, # 30 with 2 RF's Cassie [name] XR 20 mg daily 1/1 tablet BID, #30 with no RF's.Malena Peer for above e-scribed and sent to pharmacy on record  [facility] [phone], [place] - [address] [place] [address] Phone: [phone] Fax: [phone]  I discussed  the assessment and treatment plan with the patient.. The patient was provided an opportunity to ask questions and all were answered. The patient agreed with the plan and demonstrated an understanding of the instructions.   Recall Appointment: [place]   I provided 95 minutes of face-to-face time during this encounter and 20 minutes of counseling. Completed record review and laboratory work necessary for the visit during the evaluation.   The patient was advised to call back or seek an in-person evaluation if the symptoms worsen or if the condition fails to improve as anticipated.   Examiners: Carron Curie, NP

## 2021-05-03 ENCOUNTER — Other Ambulatory Visit: Payer: Self-pay

## 2021-05-03 MED ORDER — DYANAVEL XR 20 MG PO CHER: 20.0000 mg | CHEWABLE_EXTENDED_RELEASE_TABLET | Freq: Every day | ORAL | 0 refills | Status: DC

## 2021-05-03 NOTE — Telephone Encounter (Signed)
[facility] Patient is fine with med going to [facility]

## 2021-05-03 NOTE — Telephone Encounter (Signed)
Dyanavel XR tablets 20 mg 1/2 BID, # 30 with no RF's.RX for above e-scribed and sent to pharmacy on record  [facility] [place], [place] - [address] [place] [address] Phone: [phone] Fax: [phone]

## 2021-05-17 ENCOUNTER — Ambulatory Visit: Payer: 59 | Admitting: Family

## 2021-05-17 DIAGNOSIS — Z3009 Encounter for other general counseling and advice on contraception: Secondary | ICD-10-CM | POA: Diagnosis not present

## 2021-05-29 ENCOUNTER — Other Ambulatory Visit: Payer: Self-pay

## 2021-05-29 MED ORDER — DYANAVEL XR 20 MG PO CHER: 20.0000 mg | CHEWABLE_EXTENDED_RELEASE_TABLET | Freq: Every day | ORAL | 0 refills | Status: DC

## 2021-05-29 NOTE — Telephone Encounter (Signed)
Dyavavel XR 20 mg daily, # 30 with no RF's.RX for above e-scribed and sent to pharmacy on record ? ?[facility] - [place], [place] - [address] ?[address] ?[place] [address] ?Phone: [phone] Fax: [phone] ? ? ?

## 2021-06-08 ENCOUNTER — Other Ambulatory Visit: Payer: Self-pay

## 2021-06-08 MED ORDER — DYANAVEL XR 20 MG PO CHER: 20.0000 mg | CHEWABLE_EXTENDED_RELEASE_TABLET | Freq: Every day | ORAL | 0 refills | Status: DC

## 2021-06-08 NOTE — Telephone Encounter (Signed)
Dyanavel XR 20 mg tablets # 30 with no RF's. Post dated for 06/29/2021.RX for above e-scribed and sent to pharmacy on record ? ?[facility] - [place], [place] - [address] ?[address] ?[place] [address] ?Phone: [phone] Fax: [phone] ? ? ?

## 2021-06-21 ENCOUNTER — Other Ambulatory Visit: Payer: Self-pay

## 2021-06-21 ENCOUNTER — Telehealth: Payer: Self-pay

## 2021-06-21 MED ORDER — DYANAVEL XR 20 MG PO CHER: 20.0000 mg | CHEWABLE_EXTENDED_RELEASE_TABLET | Freq: Two times a day (BID) | ORAL | 0 refills | Status: DC

## 2021-07-09 DIAGNOSIS — Z3040 Encounter for surveillance of contraceptives, unspecified: Secondary | ICD-10-CM | POA: Diagnosis not present

## 2021-07-11 ENCOUNTER — Other Ambulatory Visit: Payer: Self-pay

## 2021-07-11 MED ORDER — DYANAVEL XR 10 MG PO CHER: 10.0000 mg | CHEWABLE_EXTENDED_RELEASE_TABLET | Freq: Every day | ORAL | 0 refills | Status: DC

## 2021-07-11 NOTE — Telephone Encounter (Signed)
Dyanavel XR 10 mg tablets # 30 with no RF's.RX for above e-scribed and sent to pharmacy on record ? ?[facility] - [place], [place] - [address] ?[address] ?[place] [address] ?Phone: [phone] Fax: [phone] ? ? ?

## 2021-07-12 ENCOUNTER — Telehealth: Payer: Self-pay | Admitting: Family

## 2021-07-12 MED ORDER — DYANAVEL XR 2.5 MG/ML PO SUER
10.0000 mL | Freq: Every day | ORAL | 0 refills | Status: DC
Start: 2021-07-12 — End: 2021-07-13

## 2021-07-12 NOTE — Telephone Encounter (Signed)
Discussed medication options with patient due to insurance coverage. Discussed options and will try Dyanavel liquid 10 mL daily, # 900 mL for 3 month supply, no RF's.RX for above e-scribed and sent to pharmacy on record ? ?[facility] - [place], [place] - [address] ?[address] ?[place] [address] ?Phone: [phone] Fax: [phone] ? ? ?

## 2021-07-13 ENCOUNTER — Telehealth: Payer: Self-pay

## 2021-07-13 ENCOUNTER — Other Ambulatory Visit: Payer: Self-pay

## 2021-07-13 MED ORDER — ADZENYS XR-ODT 9.4 MG PO TBED: 9.4000 mg | EXTENDED_RELEASE_TABLET | Freq: Every day | ORAL | 0 refills | Status: DC

## 2021-07-13 NOTE — Telephone Encounter (Signed)
Adzenys 9.4 mg daily # 30 with no RF's.RX for above e-scribed and sent to pharmacy on record ? ?[facility] - [place], [place] - [address] ?[address] ?[place] [address] ?Phone: [phone] Fax: [phone] ? ? ?

## 2021-07-13 NOTE — Telephone Encounter (Signed)
Denied.

## 2021-07-16 NOTE — Telephone Encounter (Signed)
Outcome ?Deniedon April 28 ?Your PA request has been denied. Additional information will be provided in the denial communication. (Message [phone]) ?

## 2021-07-20 ENCOUNTER — Telehealth: Payer: Self-pay | Admitting: Family

## 2021-07-20 MED ORDER — ADZENYS XR-ODT 18.8 MG PO TBED: 18.8000 mg | EXTENDED_RELEASE_TABLET | Freq: Every day | ORAL | 0 refills | Status: DC

## 2021-07-20 NOTE — Telephone Encounter (Signed)
Adzenys 18.8 mg daily, # 30 with no RF's.RX for above e-scribed and sent to pharmacy on record ? ?[facility] - [place], [place] - [address] ?[address] ?[place] [address] ?Phone: [phone] Fax: [phone] ? ? ?

## 2021-07-23 ENCOUNTER — Telehealth: Payer: Self-pay

## 2021-07-24 ENCOUNTER — Telehealth (INDEPENDENT_AMBULATORY_CARE_PROVIDER_SITE_OTHER): Payer: 59 | Admitting: Family

## 2021-07-24 ENCOUNTER — Encounter: Payer: Self-pay | Admitting: Family

## 2021-07-24 DIAGNOSIS — Z8659 Personal history of other mental and behavioral disorders: Secondary | ICD-10-CM

## 2021-07-24 DIAGNOSIS — Z79899 Other long term (current) drug therapy: Secondary | ICD-10-CM | POA: Diagnosis not present

## 2021-07-24 DIAGNOSIS — Z8489 Family history of other specified conditions: Secondary | ICD-10-CM

## 2021-07-24 DIAGNOSIS — Z719 Counseling, unspecified: Secondary | ICD-10-CM | POA: Diagnosis not present

## 2021-07-24 DIAGNOSIS — F900 Attention-deficit hyperactivity disorder, predominantly inattentive type: Secondary | ICD-10-CM | POA: Diagnosis not present

## 2021-07-24 DIAGNOSIS — R69 Illness, unspecified: Secondary | ICD-10-CM | POA: Diagnosis not present

## 2021-07-24 DIAGNOSIS — Z7189 Other specified counseling: Secondary | ICD-10-CM | POA: Diagnosis not present

## 2021-07-24 DIAGNOSIS — F419 Anxiety disorder, unspecified: Secondary | ICD-10-CM

## 2021-07-24 MED ORDER — BUPROPION HCL 75 MG PO TABS: 75.0000 mg | ORAL_TABLET | Freq: Every day | ORAL | 1 refills | Status: DC

## 2021-07-24 NOTE — Progress Notes (Signed)
?[place] ?[facility] ?[place] ?[place] [address] ?Dept: [phone] ?Dept Fax: [phone] ? ?Medication Check visit via Virtual Video  ? ?Patient ID:  Cassie [name]  female DOB: May 09, 2000   21 y.o.   MRN: [id number]  ? ?DATE:07/24/21 ? ?PCP: Cassie Coke, PA ? ?Virtual Visit via Video Note ? ?I connected with  Cassie [name] on 07/24/21 at  3:00 PM EDT by a video enabled telemedicine application and verified that I am speaking with the correct person using two identifiers. Patient/Parent Location: at home ?  ?I discussed the limitations, risks, security and privacy concerns of performing an evaluation and management service by telephone and the availability of in person appointments. I also discussed with the parents that there may be a patient responsible charge related to this service. The parents expressed understanding and agreed to proceed. ? ?Provider: Carolann Littler, NP  Location: work location ? ?HPI/CURRENT STATUS: ?Cassie [name] is here for medication management of the psychoactive medications for ADHD and review of educational and behavioral concerns.  ? ?Analey currently taking Adzenys 18.8 mg daily, which is working well. Takes medication daily in the morning. Medication tends to wear off around several hours after taking her medications. Cassie [name] is able to focus through school, homework & work.   ? ?Cassie [name] is eating well (eating breakfast, lunch and dinner). Cassie [name] does not have appetite suppression and no concerns. ? ?Sleeping well (getting enough sleep), sleeping through the night. Cassie [name] does not have delayed sleep onset and grinding her teeth recently.  ? ?EDUCATION/WORK: ?School: [employer] ?Summer session for 2 classes ?Transfer student in the fall ?Year/Grade:  college   ?Performance/ Grades: above average ?Services: Other: disability services  are available at school. ?Work: [employer] ?Evening shifts for the during the week ?Day into evening time ? ?Activities/ Exercise: intermittently with work ? ?MEDICAL HISTORY: ?Individual Medical History/ Review of Systems: None  Has been healthy with no visits to the PCP. [place] due yearly.  ? ?Family Medical/ Social History: Changes? None reported ?Patient Lives with:  sister and her dog ? ?MENTAL HEALTH: ?Mental Health Issues: Depression and Anxiety better controlled with current medications and wanting to lower her dose. ? ?Allergies: ?No Known Allergies ? ?Current Medications:  ?Current Outpatient Medications  ?Medication Instructions  ? Adzenys XR-ODT 18.8 mg, Oral, Daily  ? buPROPion (WELLBUTRIN) 75 mg, Oral, Daily  ? etonogestrel-ethinyl estradiol (NUVARING) 0.12-0.015 MG/24HR vaginal ring SMARTSIG:1 ring Vaginal Once a Month  ? ibuprofen (ADVIL) 200 mg, Every 6 hours PRN  ? Multiple Vitamins-Minerals (MULTIVITAMIN ADULT) CHEW Oral  ? propranolol (INDERAL) 10 mg, As needed  ? ?Medication Side Effects: None ? ?DIAGNOSES:  ?  ICD-10-CM   ?1. ADHD (attention deficit hyperactivity disorder), inattentive type  F90.0   ?  ?2. Anxiety  F41.9   ?  ?3. History of depression  Z86.59   ?  ?4. Family history of headaches  Z84.89   ?  ?5. Medication management  Z79.899   ?  ?6. Goals of care, counseling/discussion  Z71.89   ?  ?7. Patient counseled  Z71.9   ?  ? ?ASSESSMENT:     ?[name] is a 21 year old female with a history of ADHD, Anxiety and Depression. Patient has been maintained on Wellbutrin XL 150 mg daily along with recently starting on Adzenys 18.8 mg for a dose adjustment. Good efficacy for the past 2 days and no side effects. Academically  doing well at [employer] with transferring to [employer] next year. No disability services in place right now. Working several hours in the evenings and weekends. 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 Eating well with no changes. Getting some activity with work and serving. No sleep issues reported, but grinding her  teeth. Wanting to discontinue her Wellbutrin. Not currently in counseling. Will continue with the Adzenys at 18.8 mg and discuss discontinuation of the Wellbutrin.  ? ?PLAN/RECOMMENDATIONS:  ?Updates for school, progress and transferring to [employer] for the fall semester. ? ?Academic major and assistance with disability services is available for learning support at school. ? ?Work and duties with hours that change depending on the day of the week. Supported continued progression at work with her responsibilities.  ? ?Activity with dog and continued movement with work. Suggested outside activity when possible for a healthy lifestyle. ? ?Eating well with no issues and getting a variety of foods daily.  ?Suggested a MVI daily to supplement her dietary intake. ? ?Sleeping well with no issues reported and no current concerns with maintenance. Grinding her teeth at night and suggested contacting dentist for a night guard.  ? ?Discussed Wellbutrin with discontinuation and how to slowly lower dose over the next few weeks. Will call with any concerns or issues with lower dose.  ? ?Counseled medication pharmacokinetics, options, dosage, administration, desired effects, and possible side effects.   ?Adzenys 18.8 mg daily, no Rx today ?Decreased Wellbutrin to 75 mg daily, # 30 with 1 RF's ?RX for above e-scribed and sent to pharmacy on record [facility] [place] ?[place], [place]. ? ?I discussed the assessment and treatment plan with the patient. The patient was provided an opportunity to ask questions and all were answered. The patient agreed with the plan and demonstrated an understanding of the instructions. ?  ?NEXT APPOINTMENT:  ?10/30/2021-f/u visit  ?Telehealth OK ? ?The patient was advised to call back or seek an in-person evaluation if the symptoms worsen or if the condition fails to improve as anticipated. ? ? ?Carolann Littler, NP ? ?

## 2021-07-25 ENCOUNTER — Encounter: Payer: Self-pay | Admitting: Family

## 2021-08-07 ENCOUNTER — Telehealth: Payer: 59 | Admitting: Family

## 2021-08-08 ENCOUNTER — Telehealth: Payer: Self-pay

## 2021-08-08 ENCOUNTER — Telehealth: Payer: Self-pay | Admitting: Family

## 2021-08-08 MED ORDER — ADZENYS XR-ODT 18.8 MG PO TBED: 37.6000 mg | EXTENDED_RELEASE_TABLET | Freq: Every day | ORAL | 0 refills | Status: DC

## 2021-08-08 NOTE — Telephone Encounter (Signed)
Patient requested increase to 2 tablets daily of Adzenys 18.8 mg daily, # 60 with no RF's.RX for above e-scribed and sent to pharmacy on record  [facility] [place], [place] - [address] [place] [address] Phone: [phone] Fax: [phone]

## 2021-08-20 DIAGNOSIS — B379 Candidiasis, unspecified: Secondary | ICD-10-CM | POA: Diagnosis not present

## 2021-08-20 DIAGNOSIS — B3731 Acute candidiasis of vulva and vagina: Secondary | ICD-10-CM | POA: Diagnosis not present

## 2021-08-20 DIAGNOSIS — L293 Anogenital pruritus, unspecified: Secondary | ICD-10-CM | POA: Diagnosis not present

## 2021-08-20 DIAGNOSIS — N898 Other specified noninflammatory disorders of vagina: Secondary | ICD-10-CM | POA: Diagnosis not present

## 2021-08-20 DIAGNOSIS — Z3040 Encounter for surveillance of contraceptives, unspecified: Secondary | ICD-10-CM | POA: Diagnosis not present

## 2021-08-24 NOTE — Telephone Encounter (Signed)
Denied.

## 2021-08-28 ENCOUNTER — Encounter: Payer: Self-pay | Admitting: Family

## 2021-08-28 ENCOUNTER — Telehealth (INDEPENDENT_AMBULATORY_CARE_PROVIDER_SITE_OTHER): Payer: 59 | Admitting: Family

## 2021-08-28 ENCOUNTER — Encounter: Payer: 59 | Admitting: Family

## 2021-08-28 DIAGNOSIS — F32A Depression, unspecified: Secondary | ICD-10-CM | POA: Diagnosis not present

## 2021-08-28 DIAGNOSIS — R5383 Other fatigue: Secondary | ICD-10-CM | POA: Diagnosis not present

## 2021-08-28 DIAGNOSIS — F900 Attention-deficit hyperactivity disorder, predominantly inattentive type: Secondary | ICD-10-CM | POA: Diagnosis not present

## 2021-08-28 DIAGNOSIS — Z8659 Personal history of other mental and behavioral disorders: Secondary | ICD-10-CM | POA: Diagnosis not present

## 2021-08-28 DIAGNOSIS — Z789 Other specified health status: Secondary | ICD-10-CM | POA: Diagnosis not present

## 2021-08-28 DIAGNOSIS — R630 Anorexia: Secondary | ICD-10-CM

## 2021-08-28 DIAGNOSIS — Z7689 Persons encountering health services in other specified circumstances: Secondary | ICD-10-CM | POA: Diagnosis not present

## 2021-08-28 DIAGNOSIS — Z719 Counseling, unspecified: Secondary | ICD-10-CM | POA: Diagnosis not present

## 2021-08-28 DIAGNOSIS — Z91148 Patient's other noncompliance with medication regimen for other reason: Secondary | ICD-10-CM | POA: Diagnosis not present

## 2021-08-28 DIAGNOSIS — F419 Anxiety disorder, unspecified: Secondary | ICD-10-CM

## 2021-08-28 DIAGNOSIS — Z7189 Other specified counseling: Secondary | ICD-10-CM | POA: Diagnosis not present

## 2021-08-28 DIAGNOSIS — R69 Illness, unspecified: Secondary | ICD-10-CM | POA: Diagnosis not present

## 2021-08-28 MED ORDER — LISDEXAMFETAMINE DIMESYLATE 30 MG PO CAPS: 30.0000 mg | ORAL_CAPSULE | Freq: Every day | ORAL | 0 refills | Status: DC

## 2021-08-28 NOTE — Progress Notes (Signed)
[facility] [address], [place] [place] [address] Dept: [phone] Dept Fax: [phone]  Medication Check visit via Virtual Video   Patient ID:  Cassie [name]  female DOB: [birth year]   21 y.o.   MRN: [id number]   DATE:08/28/21  PCP: Jarold Motto, PA  Virtual Visit via Video Note  I connected with  Jenne Campus on 08/28/21 at  1:00 PM EDT by a video enabled telemedicine application and verified that I am speaking with the correct person using two identifiers. Patient/Parent Location: At home  I discussed the limitations, risks, security and privacy concerns of performing an evaluation and management service by telephone and the availability of in person appointments. I also discussed with the parents that there may be a patient responsible charge related to this service. The parents expressed understanding and agreed to proceed.  Provider: Carron Curie, NP  Location: work location  HPI/CURRENT STATUS: [name] is here for medication management of the psychoactive medications for ADHD and review of educational and behavioral concerns.   Samah currently taking Adzenys 18.8 mg 2-3 tablets daily, which is working well. Takes medication in the morning and at night with her Adzenys.. Medication tends to wear off around late evening. Kamyah is able to focus through school and work with some homework. Recently stopped taking her Wellbutrin (2 weeks ago) on her own.   Braylea is eating well (eating breakfast, lunch and dinner). Orlene does have appetite suppression and attempting to eating more protein and changed her diet to vegetarian recently.   Sleeping well (getting about 8 hours now, but with school was only getting 5 hours), sleeping through the night. Clifford has some delayed sleep onset due to work and homework schedule.   EDUCATION/WORK: School: [employer] now with summer  classes and transferring to [employer] Year/Grade:  college   Performance/ Grades: average Services: Other: none Work:  Water quality scientist Exercise: intermittently  MEDICAL HISTORY: Individual Medical History/ Review of Systems: None reported recently.  Has been healthy with no visits to the PCP. [facility] due yearly.   Family Medical/ Social History: None Patient Lives with:  sister  and dog.   MENTAL HEALTH: Mental Health Issues:   Depression and Anxiety-still havng symptoms and stopped her Wellbutrin on her own 2 weeks ago.  Allergies: No Known Allergies  Current Medications:  Current Outpatient Medications on File Prior to Visit  Medication Sig Dispense Refill   Amphetamine ER (ADZENYS XR-ODT) 18.8 MG TBED Take 37.6 mg by mouth daily. 60 tablet 0   buPROPion (WELLBUTRIN) 75 MG tablet Take 1 tablet (75 mg total) by mouth daily. 30 tablet 1   etonogestrel-ethinyl estradiol (NUVARING) 0.12-0.015 MG/24HR vaginal ring SMARTSIG:1 ring Vaginal Once a Month     ibuprofen (ADVIL,MOTRIN) 200 MG tablet Take 200 mg by mouth every 6 (six) hours as needed. Taking two tablets as needed (Patient not taking: Reported on 02/01/2021)     Multiple Vitamins-Minerals (MULTIVITAMIN ADULT) CHEW Chew by mouth.     propranolol (INDERAL) 10 MG tablet Take 10 mg by mouth as needed. (Patient not taking: Reported on 05/01/2021)     No current facility-administered medications on file prior to visit.   Medication Side Effects: Appetite Suppression, Fatigue, and Other: sleep issues.   DIAGNOSES:    ICD-10-CM   1. ADHD (attention deficit hyperactivity disorder), inattentive type  F90.0     2. Anxiety  F41.9     3. History of depression  Z86.59     4. Poor appetite  R63.0     5. Vegetarian diet  Z78.9     6. Fatigue due to depression  F32.A    R53.83     7. Sleep concern  Z76.89     8. Medication discontinued without order  Z91.148     9. Patient counseled  Z71.9     10. Goals of care, counseling/discussion   Z71.89      ASSESSMENT:   Cassie [name] is a 21 year old female with a history of ADHD, Anxiety and Depression. She was prescribed Wellbutrin and Adzenys to assist with symptom control. Cassie [name] stopped the Wellbutin 75 mg without medical advice 2 weeks ago and is now taking 2-3 Adzenys 18.8 mg daily. Efficacy has been limited with feeling fatigue, lack of appetite, sleep schedule was inconsistent due to school and work along with changing her diet to vegetarian over the past 2 months with limited knowledge. Academically did well and taking summer class online to transfer to [employer] in the fall. No other medical changes since the last visit. Medication management, symptom control, and adherence to be discussed with patient.    PLAN/RECOMMENDATIONS:  School updates, classed, academic progress and transfer discussed.  Schedule with work and school due to hours and responsibilities discussed.   More symptoms of fatigue due to change in medication, diet and sleep schedule reviewed with Cassie [name].   Needing more dietary guidance due to change in habits. Will send information via e-mail for nutritional counseling with a dietician.   Ongoing issues with anxiety and depression discussed. Strongly encouraged seeking a counseling and will send information for patient to follow up with initial visit.   Sleep hygiene and sleep schedule discussed at length due to changes in required sleep and the overall effect daily.   Medication management of current symptoms discussed at length. Addressed concerns for medication reliability and need for coping mechanisms.   Current medication regimen and options discussed with patient related to the need for change due to required amount of current stimulant.   Counseled medication pharmacokinetics, options, dosage, administration, desired effects, and possible side effects.   Stopped Adzenys Trial Vyvanse 30 mg daily, # 30 with no RF's Wellbutrin-hold. Malena Peer for above  e-scribed and sent to pharmacy on record  [facility] [place], [place] - [address] [place] [address] Phone: [phone] Fax: [phone]  I discussed the assessment and treatment plan with the patient/parent. The patient/parent was provided an opportunity to ask questions and all were answered. The patient/ parent agreed with the plan and demonstrated an understanding of the instructions.   NEXT APPOINTMENT:  10/30/2021   f/u visit for routine care Telehealth OK  The patient/parent was advised to call back or seek an in-person evaluation if the symptoms worsen or if the condition fails to improve as anticipated.  Carron Curie, NP

## 2021-09-07 ENCOUNTER — Telehealth: Payer: Self-pay | Admitting: Family

## 2021-09-07 MED ORDER — LISDEXAMFETAMINE DIMESYLATE 70 MG PO CAPS: 70.0000 mg | ORAL_CAPSULE | Freq: Every day | ORAL | 0 refills | Status: DC

## 2021-09-07 NOTE — Telephone Encounter (Signed)
Medication management and need for greater coverage. Patient had increased the dose of the Vyvanse as instructed. Vyvanse 70 mg daily, # 30 with no RF's.RX for above e-scribed and [facility]

## 2021-10-02 ENCOUNTER — Other Ambulatory Visit: Payer: Self-pay | Admitting: Family

## 2021-10-02 NOTE — Telephone Encounter (Signed)
Wellbutrin 75 mg daily, # 90 with 1 RF"s.RX for above e-scribed and sent to pharmacy on record  [phone], [place] - [address] [place] [address] Phone: [phone] Fax: [phone]

## 2021-10-03 IMAGING — US US ABDOMEN COMPLETE
1 series · 13 of 25 positions shown · non-contrast
Comparison: 06/30/2017

CLINICAL DATA: Generalized abdominal pain with nausea and vomiting
over the last 3 months.

EXAM:
ABDOMEN ULTRASOUND COMPLETE

[Series 1: us abdomen complete · 13 of 84 slices shown]
[im 1/84]
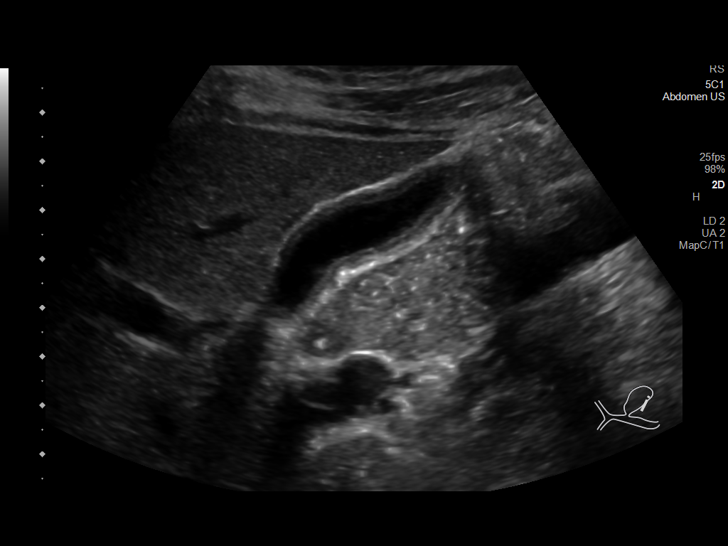
[im 7/84]
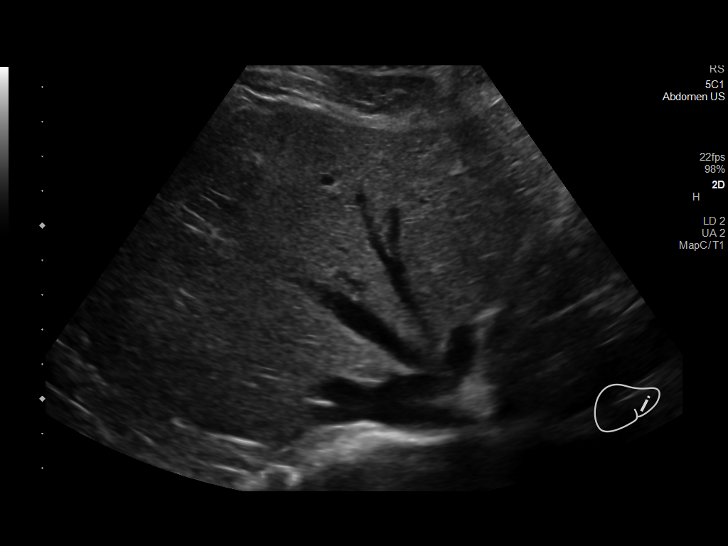
[im 14/84]
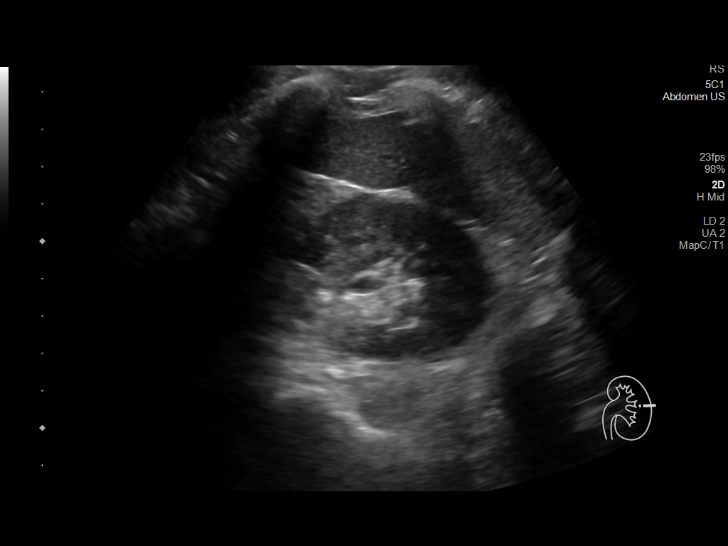
[im 21/84]
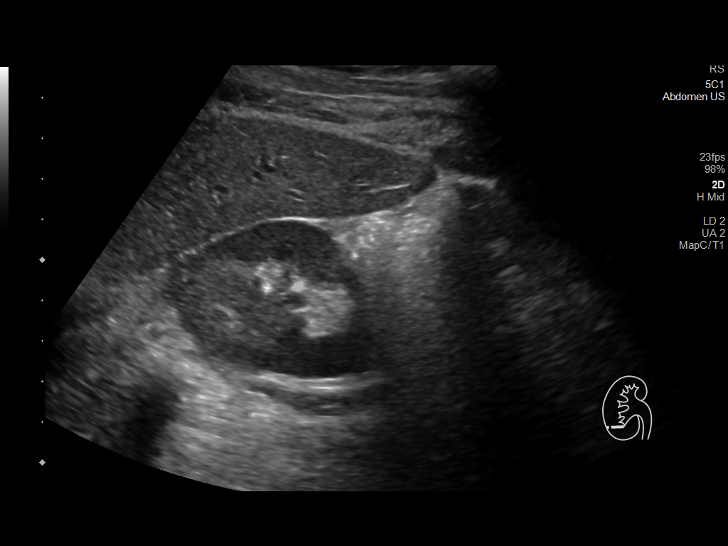
[im 28/84]
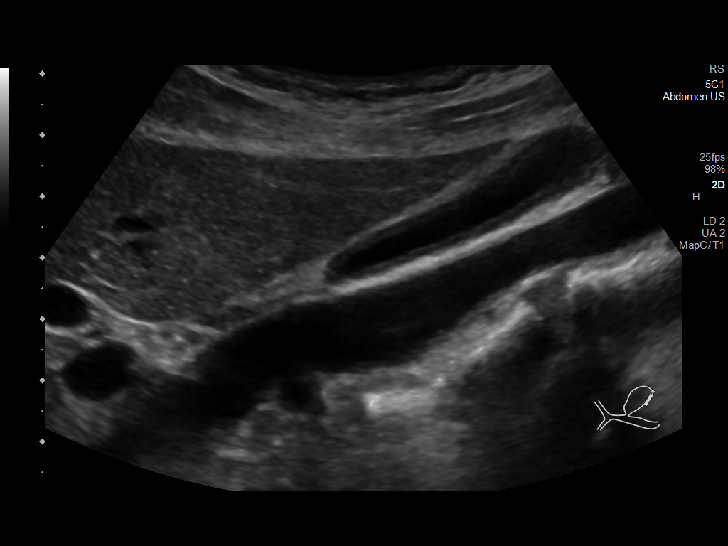
[im 35/84]
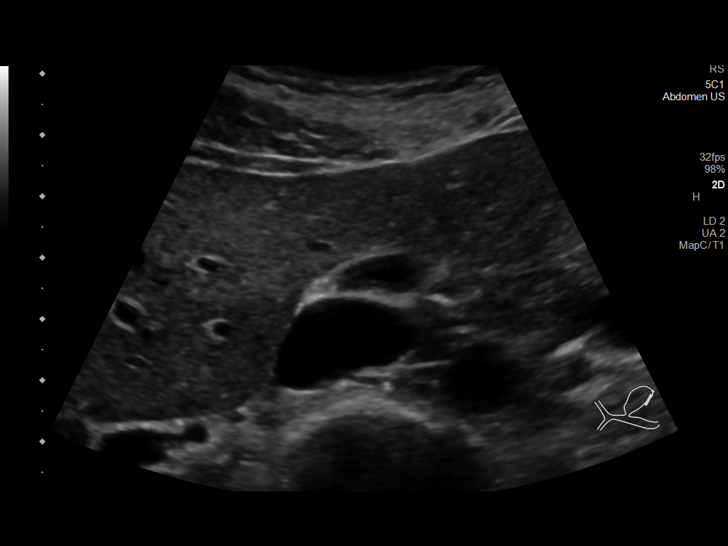
[im 42/84]
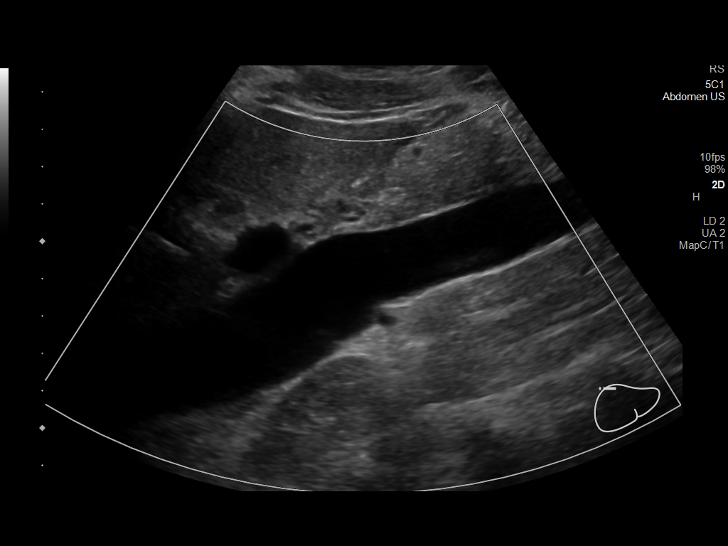
[im 49/84]
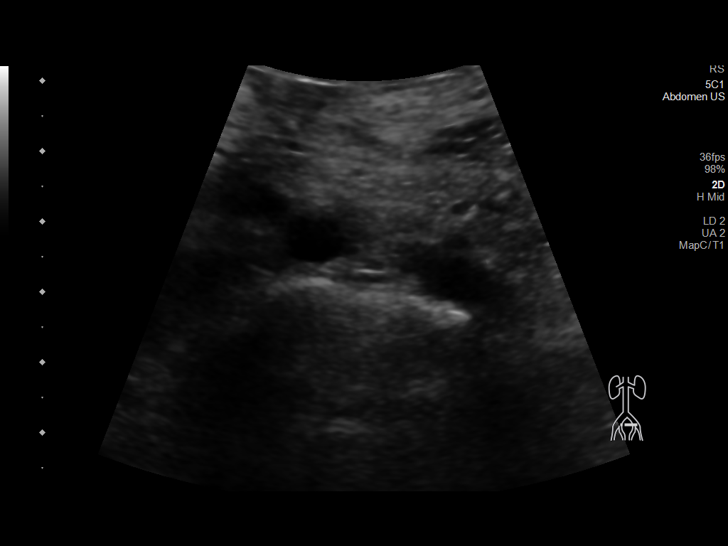
[im 56/84]
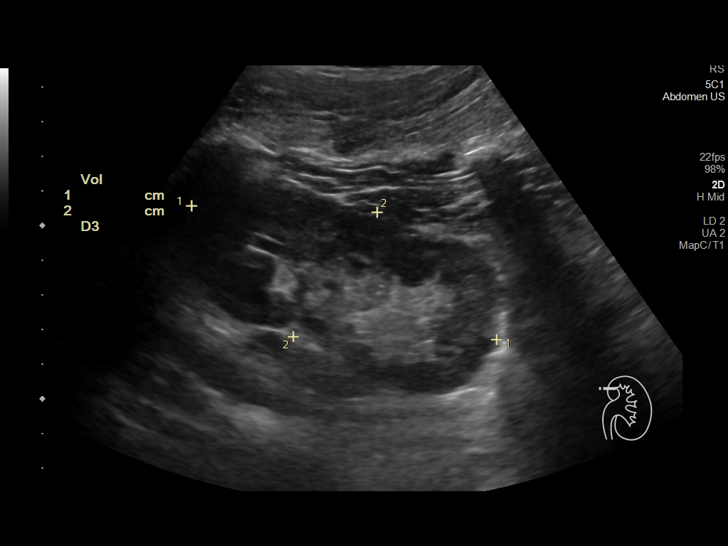
[im 63/84]
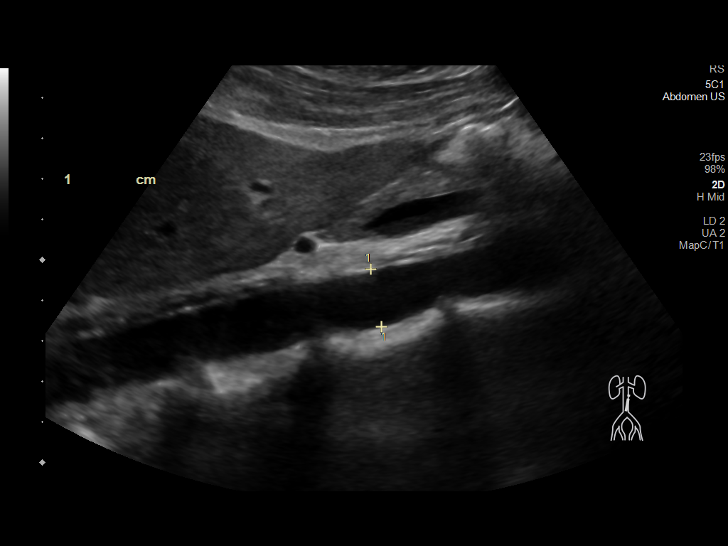
[im 70/84]
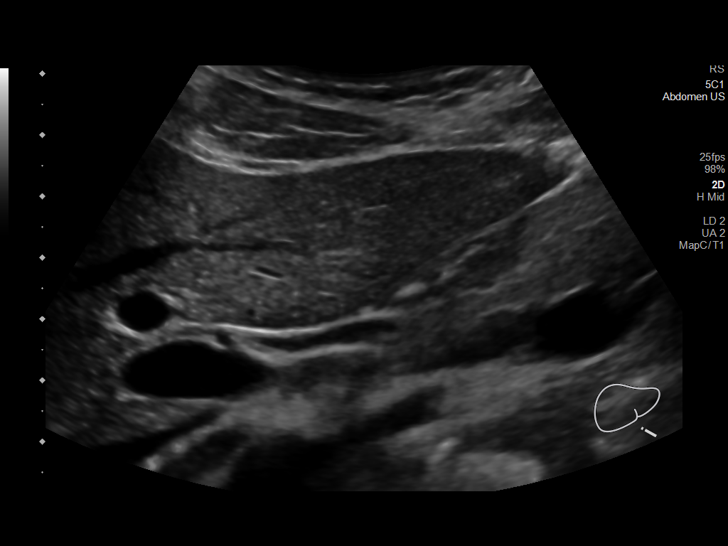
[im 77/84]
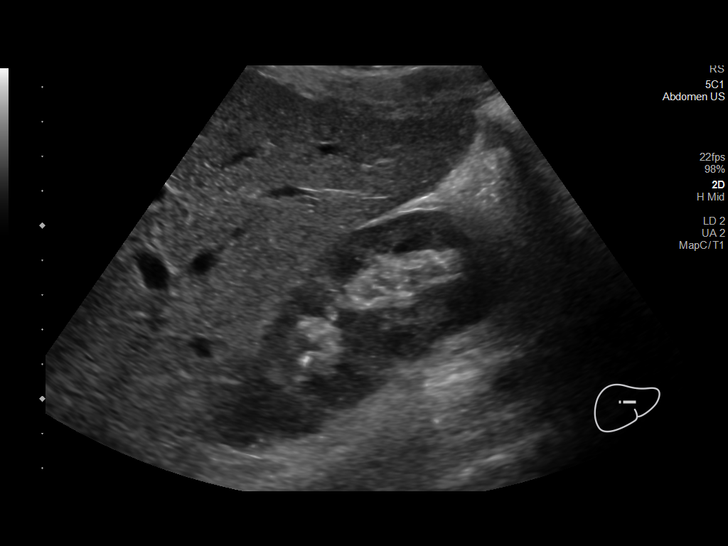
[im 84/84]
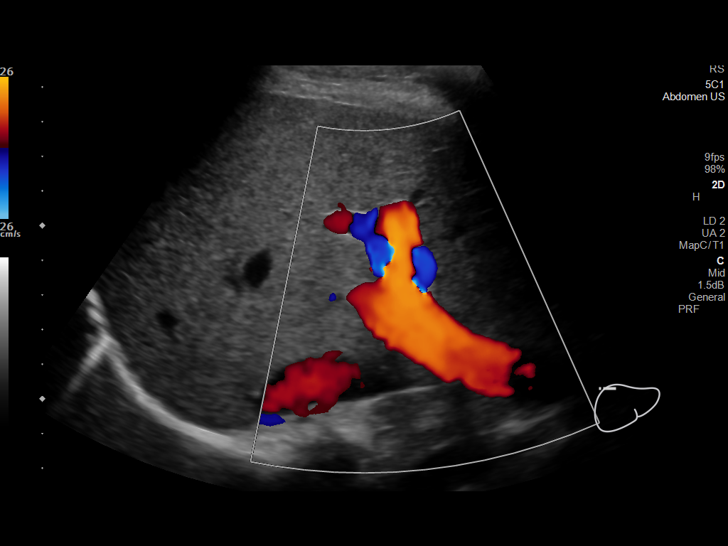

[13 of 25 positions shown; findings below may reference images not displayed]

FINDINGS: Gallbladder: No gallstones, surrounding fluid or Murphy sign. The
gallbladder is somewhat contracted and the wall measures 3 mm thick,
not thickened given the degree of contraction. These findings argue
against gallbladder pathology.

Common bile duct: Diameter: 3 mm, normal

Liver: No focal lesion identified. Within normal limits in
parenchymal echogenicity. Portal vein is patent on color Doppler
imaging with normal direction of blood flow towards the liver.

IVC: No abnormality visualized.

Pancreas: Visualized portion unremarkable.

Spleen: Size and appearance within normal limits.

Right Kidney: Length: 10.1 cm. Echogenicity within normal limits. No
mass or hydronephrosis visualized.

Left Kidney: Length: 9.6 cm. Echogenicity within normal limits. No
mass or hydronephrosis visualized.

Abdominal aorta: Normal appearance.

Other findings: No ascites or other regional finding.
IMPRESSION: The gallbladder is contracted, probably due to inadequate fasting.
Allowing for that, the gallbladder and liver appear normal. See
above discussion. The remainder of the exam is normal.

## 2021-10-09 ENCOUNTER — Other Ambulatory Visit: Payer: Self-pay

## 2021-10-09 MED ORDER — LISDEXAMFETAMINE DIMESYLATE 70 MG PO CAPS: 70.0000 mg | ORAL_CAPSULE | Freq: Every day | ORAL | 0 refills | Status: DC

## 2021-10-09 NOTE — Telephone Encounter (Signed)
Vyvanse 70 mg daily # 30 with no RF's.RX for above e-scribed and sent to pharmacy on record  [phone] [facility], [place] - [address] [place] [address] Phone: [phone] Fax: [phone]

## 2021-10-11 ENCOUNTER — Telehealth: Payer: Self-pay | Admitting: Family

## 2021-10-11 ENCOUNTER — Other Ambulatory Visit (HOSPITAL_COMMUNITY): Payer: Self-pay

## 2021-10-11 MED ORDER — LISDEXAMFETAMINE DIMESYLATE 70 MG PO CAPS
70.0000 mg | ORAL_CAPSULE | Freq: Every day | ORAL | 0 refills | Status: DC
  Filled 2021-10-11: qty 30, 30d supply, fill #0

## 2021-10-11 NOTE — Telephone Encounter (Signed)
Vyvanse 70 mg daily # 30 with no Rf's.RX for above e-scribed and sent to pharmacy on record   [facility] [address] [place] [address] Phone: [phone] Fax: [phone]

## 2021-10-11 NOTE — Telephone Encounter (Signed)
Cassie [name] called and stated previous pharmacy didn't have vyvanse in stock , she stated she wants it sent to [facility].

## 2021-10-15 ENCOUNTER — Encounter: Payer: Self-pay | Admitting: Physician Assistant

## 2021-10-30 ENCOUNTER — Encounter: Payer: Self-pay | Admitting: Family

## 2021-10-30 ENCOUNTER — Ambulatory Visit (INDEPENDENT_AMBULATORY_CARE_PROVIDER_SITE_OTHER): Payer: 59 | Admitting: Family

## 2021-10-30 VITALS — BP 116/76 | HR 76 | Resp 16 | Ht 67.0 in | Wt 116.0 lb

## 2021-10-30 DIAGNOSIS — Z719 Counseling, unspecified: Secondary | ICD-10-CM | POA: Diagnosis not present

## 2021-10-30 DIAGNOSIS — Z7189 Other specified counseling: Secondary | ICD-10-CM | POA: Diagnosis not present

## 2021-10-30 DIAGNOSIS — F419 Anxiety disorder, unspecified: Secondary | ICD-10-CM | POA: Diagnosis not present

## 2021-10-30 DIAGNOSIS — F900 Attention-deficit hyperactivity disorder, predominantly inattentive type: Secondary | ICD-10-CM

## 2021-10-30 DIAGNOSIS — Z79899 Other long term (current) drug therapy: Secondary | ICD-10-CM

## 2021-10-30 DIAGNOSIS — R69 Illness, unspecified: Secondary | ICD-10-CM | POA: Diagnosis not present

## 2021-10-30 DIAGNOSIS — Z8659 Personal history of other mental and behavioral disorders: Secondary | ICD-10-CM

## 2021-10-30 MED ORDER — GUANFACINE HCL ER 1 MG PO TB24: 1.0000 mg | ORAL_TABLET | Freq: Every day | ORAL | 2 refills | Status: DC

## 2021-10-30 NOTE — Progress Notes (Signed)
[name] [address]  [place] [address] Dept: [phone] Dept Fax: [phone] Loc: [phone] Loc Fax: [phone]  Medication Check  Patient ID: Cassie [name], female  DOB: [birth year], 21 y.o.  MRN: [id number]  Date of Evaluation: 10/30/2021 PCP: Cassie Motto, PA  Accompanied by:  self Patient Lives with:  sister and dog  HISTORY/CURRENT STATUS: HPI Patient here by herself for the visit. Patient interactive and appropriate with provider. Some changes reported with her gut health for controlling her anxiety and depression with supplements. Cassie [name] has continued to research and participate with bettering her overall health with eating and exercising. She has been consistent with her Vyvanse 70 mg daily at 0800 and only lasting until 3-4:00 pm. May need another dose or alternative medication for pm coverage.   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   Individual Medical History/ Review of Systems: Changes? :None reported  Allergies: Patient has no known allergies.  Current Medications:  Current Outpatient Medications  Medication Instructions  . etonogestrel-ethinyl estradiol (NUVARING) 0.12-0.015 MG/24HR vaginal ring SMARTSIG:1 ring Vaginal Once a Month  . ibuprofen (ADVIL) 200 mg, Every 6 hours PRN  . Multiple Vitamins-Minerals (MULTIVITAMIN ADULT) CHEW Oral  . Vyvanse 70  mg, Oral, Daily   Medication Side Effects: None Family Medical/ Social History: Changes? None reported  MENTAL HEALTH: Mental Health Issues: Depression and Anxiety-ups and downs but better symptom control.   PHYSICAL EXAM; Vitals:  Vitals:   10/30/21 0810  BP: 116/76  Pulse: 76  Resp: 16  Weight: 116 lb (52.6 kg)  Height: 5\' 7"  (1.702 m)    General Physical Exam: Unchanged from previous exam, date:08/28/2021 Changed:None  DIAGNOSES:    ICD-10-CM   1. ADHD (attention deficit hyperactivity disorder), inattentive type  F90.0     2. Anxiety  F41.9     3. History of depression  Z86.59     4. Medication management  Z79.899     5. Patient counseled  Z71.9     6. Goals of care, counseling/discussion  Z71.89      ASSESSMENT: Cassie [name] is a  21 year old female with a history of ADHD, Anxiety and Depression. She has been on Vyvanse 70 mg for the day time but no other medications for her anxiety or depression. She has been more aware of her gut health with correcting her GI tract with supplement to assist along with healthy eating habits. Cassie [name] is transferring to [employer] [name] this semester with a full time schedule. She is continuing to work with cutting back to 3-4 days during the week at [name].   RECOMMENDATIONS:  Updates with school and transferring to [name] this semester for school.  No formal services in place but tutoring and assistance can be sought out.    Discussed medication options for coverage with evening classes and working later.  Medication management with current Vyvanse dose with limited coverage for the evening time with school and work.   Counseled medication pharmacokinetics, options, dosage, administration, desired effects, and possible side effects.   Vyvanse  70 mg daily Intuniv 1 mg daily, # 30 with 2 RF's.RX for above e-scribed and sent to pharmacy on record  Cassie [name] Phone: [phone] Fax: [phone]  I discussed the assessment and treatment plan with the patient. The patient was provided an opportunity to ask questions and all were answered. The patient agreed with the plan and demonstrated an understanding of the instructions.  NEXT APPOINTMENT: No follow-ups on file.  The patient was advised to call back or seek an in-person evaluation if the symptoms worsen or if the condition fails to improve as anticipated.  Carron Curie, NP

## 2021-10-31 ENCOUNTER — Encounter: Payer: Self-pay | Admitting: Family

## 2021-11-06 ENCOUNTER — Other Ambulatory Visit: Payer: Self-pay

## 2021-11-06 ENCOUNTER — Other Ambulatory Visit (HOSPITAL_COMMUNITY): Payer: Self-pay

## 2021-11-06 MED ORDER — LISDEXAMFETAMINE DIMESYLATE 70 MG PO CAPS
70.0000 mg | ORAL_CAPSULE | Freq: Every day | ORAL | 0 refills | Status: DC
Start: 2021-11-06 — End: 2021-11-08
  Filled 2021-11-06: qty 30, 30d supply, fill #0

## 2021-11-06 NOTE — Telephone Encounter (Signed)
Vyvanse 70 mg daily, # 30 with no RF's.RX for above e-scribed and sent to pharmacy on record  [facility] [address] [place] [address] Phone: [phone] Fax: [phone]

## 2021-11-08 MED ORDER — AZSTARYS 26.1-5.2 MG PO CAPS: 26.1000 mg | ORAL_CAPSULE | Freq: Every day | ORAL | 0 refills | Status: DC

## 2021-11-08 NOTE — Telephone Encounter (Signed)
Azstarys 26.1-5.2 mg daily, # 30 with no RF's.National Drug shortage for Vyvanse and stopped for now. RX for above e-scribed and sent to pharmacy on record  [phone], [place] - [address] [place] [address] Phone: [phone] Fax: [phone]

## 2021-11-10 ENCOUNTER — Other Ambulatory Visit (HOSPITAL_COMMUNITY): Payer: Self-pay

## 2021-11-16 ENCOUNTER — Other Ambulatory Visit (HOSPITAL_COMMUNITY): Payer: Self-pay

## 2021-11-16 ENCOUNTER — Other Ambulatory Visit: Payer: Self-pay

## 2021-11-16 MED ORDER — LISDEXAMFETAMINE DIMESYLATE 50 MG PO CAPS
50.0000 mg | ORAL_CAPSULE | ORAL | 0 refills | Status: DC
  Filled 2021-11-16: qty 30, 30d supply, fill #0

## 2021-11-16 NOTE — Telephone Encounter (Signed)
RX for above e-scribed and sent to pharmacy on record  [facility] [address] [place] [address] Phone: [phone] Fax: [phone] 

## 2021-11-16 NOTE — Telephone Encounter (Signed)
Patient would like for Vyvanse 50mg  sent to [facility]

## 2021-11-20 ENCOUNTER — Telehealth: Payer: Self-pay

## 2021-11-20 ENCOUNTER — Other Ambulatory Visit (HOSPITAL_COMMUNITY): Payer: Self-pay

## 2021-11-21 ENCOUNTER — Other Ambulatory Visit: Payer: Self-pay | Admitting: Family

## 2021-11-21 NOTE — Telephone Encounter (Signed)
Intuniv 1 mg daily, # 90 with 1 RF's.RX for above e-scribed and sent to pharmacy on record  [facility] [phone] [facility], [place] - [address] [place] [address] Phone: [phone] Fax: [phone]

## 2021-11-22 ENCOUNTER — Other Ambulatory Visit (HOSPITAL_COMMUNITY): Payer: Self-pay

## 2021-12-10 ENCOUNTER — Encounter: Payer: Self-pay | Admitting: *Deleted

## 2021-12-11 DIAGNOSIS — N930 Postcoital and contact bleeding: Secondary | ICD-10-CM | POA: Diagnosis not present

## 2021-12-11 DIAGNOSIS — B3731 Acute candidiasis of vulva and vagina: Secondary | ICD-10-CM | POA: Diagnosis not present

## 2021-12-11 DIAGNOSIS — N898 Other specified noninflammatory disorders of vagina: Secondary | ICD-10-CM | POA: Diagnosis not present

## 2021-12-11 DIAGNOSIS — Z113 Encounter for screening for infections with a predominantly sexual mode of transmission: Secondary | ICD-10-CM | POA: Diagnosis not present

## 2021-12-11 DIAGNOSIS — R3915 Urgency of urination: Secondary | ICD-10-CM | POA: Diagnosis not present

## 2021-12-11 DIAGNOSIS — R69 Illness, unspecified: Secondary | ICD-10-CM | POA: Diagnosis not present

## 2021-12-18 ENCOUNTER — Other Ambulatory Visit: Payer: Self-pay

## 2021-12-19 MED ORDER — LISDEXAMFETAMINE DIMESYLATE 50 MG PO CAPS
50.0000 mg | ORAL_CAPSULE | ORAL | 0 refills | Status: DC
Start: 2021-12-19 — End: 2022-01-10

## 2021-12-19 NOTE — Telephone Encounter (Signed)
Vyvanse 50 mg daily, #30 with no RF's.RX for above e-scribed and sent to pharmacy on record  [id number] [place], [place] - [address] [place] [address] Phone: [phone] Fax: [phone]     

## 2022-01-10 ENCOUNTER — Other Ambulatory Visit: Payer: Self-pay

## 2022-01-10 MED ORDER — LISDEXAMFETAMINE DIMESYLATE 50 MG PO CAPS
50.0000 mg | ORAL_CAPSULE | ORAL | 0 refills | Status: DC
Start: 2022-01-10 — End: 2022-02-12

## 2022-01-10 NOTE — Telephone Encounter (Signed)
Vyvanse 50 mg daily, #30 with no RF's.RX for above e-scribed and sent to pharmacy on record  [id number] [place], [place] [address] [place] [address] Phone: [phone] Fax: [phone]

## 2022-01-22 ENCOUNTER — Telehealth (INDEPENDENT_AMBULATORY_CARE_PROVIDER_SITE_OTHER): Payer: 59 | Admitting: Family

## 2022-01-22 ENCOUNTER — Encounter: Payer: Self-pay | Admitting: Family

## 2022-01-22 VITALS — BP 110/78 | HR 76 | Resp 16 | Ht 67.0 in | Wt 113.2 lb

## 2022-01-22 DIAGNOSIS — Z8659 Personal history of other mental and behavioral disorders: Secondary | ICD-10-CM | POA: Diagnosis not present

## 2022-01-22 DIAGNOSIS — Z7189 Other specified counseling: Secondary | ICD-10-CM

## 2022-01-22 DIAGNOSIS — F419 Anxiety disorder, unspecified: Secondary | ICD-10-CM | POA: Diagnosis not present

## 2022-01-22 DIAGNOSIS — Z719 Counseling, unspecified: Secondary | ICD-10-CM

## 2022-01-22 DIAGNOSIS — F900 Attention-deficit hyperactivity disorder, predominantly inattentive type: Secondary | ICD-10-CM

## 2022-01-22 DIAGNOSIS — R69 Illness, unspecified: Secondary | ICD-10-CM | POA: Diagnosis not present

## 2022-01-22 DIAGNOSIS — Z79899 Other long term (current) drug therapy: Secondary | ICD-10-CM | POA: Diagnosis not present

## 2022-01-22 MED ORDER — AMPHETAMINE SULFATE 10 MG PO TABS: 10.0000 mg | ORAL_TABLET | Freq: Every day | ORAL | 0 refills | Status: DC

## 2022-01-22 NOTE — Progress Notes (Signed)
[facility]  [facility] [address] [place] [address] Dept: [phone] Dept Fax: [phone] Loc: [phone] Loc Fax: [phone]  Medication Check  Patient ID: Geanie Cooley, female  DOB: [birth year], 21 y.o.  MRN: [id number]  Date of Evaluation: 01/22/2022 PCP: Inda Coke, PA  Accompanied by:  self Patient Lives with:  sister  HISTORY/CURRENT STATUS: HPI Patient here by herself for the visit today. Patient interactive and appropriate with provider today. Patient has had no significant changes since last f/u visit. Taking her Vyvanse 50 mg daily with no pm coverage. Patient stopped the Intuniv and needing assisting with home work and work in the evening.   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    Sleep: Bedtime: 10:00 pm-12:00 am   Awakens: 0720-0900 am most weekdays  Concerns: Initiation/Maintenance/Other: None  Individual Medical History/ Review of Systems: Changes? :None reported recently.   Allergies: Patient has no known allergies.  Current Medications:  Current Outpatient Medications  Medication Instructions   Amphetamine Sulfate (EVEKEO) 10 mg, Oral, Daily   etonogestrel-ethinyl estradiol (NUVARING) 0.12-0.015 MG/24HR vaginal ring SMARTSIG:1 ring Vaginal Once a Month   guanFACINE (INTUNIV) 1 mg, Oral, Daily   ibuprofen (ADVIL) 200 mg, Every 6 hours PRN   lisdexamfetamine (VYVANSE) 50 mg, Oral, BH-each morning   Multiple Vitamins-Minerals (MULTIVITAMIN ADULT) CHEW Oral   Medication Side Effects:  None Family Medical/ Social History: Changes? None  MENTAL HEALTH: Mental Health Issues: Depression and Anxiety-not taking any medications.To start with counseling today with Marcella.  PHYSICAL EXAM; Vitals: Vitals:   01/22/22 0737  BP: 110/78  Pulse: 76  Resp: 16  Weight: 113 lb 3.2 oz (51.3 kg)  Height: 5\' 7"  (1.702 m)    General Physical Exam: Unchanged from previous exam, date:10/30/2021 Changed:None  DIAGNOSES:    ICD-10-CM   1. ADHD (attention deficit hyperactivity disorder), inattentive type  F90.0     2. Anxiety  F41.9     3. History of depression  Z86.59     4. Medication management  Z79.899     5. Patient counseled  Z71.9     6. Goals of care, counseling/discussion  Z71.89     ASSESSMENT: Iyari is a 21 year old female with a history of ADHD, Anxiety, and Depression. She has continued with taking Vyvanse 50 mg daily and stopped the Intuniv for better coverage. Patient reports that the afternoon is more difficult with school work and work on the Vyvanse 50 mg. Not currently taking any medication for her anxiety or depression. Starting counseling today to assist with emotional regulation. Academically doing well this semester and no services in place for learning. Working part time during the week. Eating better with supplements for her dietary intake. Sleeping on a more routine schedule. Medication management of pm symptoms to be addressed today.   RECOMMENDATIONS:  Updates with school and progress for the fall semester at [employer].  No formal services in place but help is available on campus.  Discussed eating habits and supplement for nutrition used daily.  Exercise and activity is completed on a regular basis.  Working longer shifts and completion of school work discussed with Armed forces training and education officer   Sleep  habits discussed with better sleep routine with no current issues reported.  Starting counseling today with support for assistance with her emotional  regulation.   Discussed pm difficulties with work and school work with limited focus after a certain time.   Medication management of current symptoms and difficulties encountered in the afternoon.   Counseled medication pharmacokinetics, options, dosage, administration, desired effects, and possible side effects.    Vyvanse 50 mg daily, no Rx today Stopped the Intuniv Evekeo 10 mg in the afternoon, #30 with no RF's.RX for above e-scribed and sent to pharmacy on record  [id number] [place], [place] - [address] [place] [address] Phone: [phone] Fax: [phone]  I discussed the assessment and treatment plan with the patient. The patient was provided an opportunity to ask questions and all were answered. The patient agreed with the plan and demonstrated an understanding of the instructions.   NEXT APPOINTMENT: Return in about 3 months (around 04/24/2022) for f/u visit .  The patient was advised to call back or seek an in-person evaluation if the symptoms worsen or if the condition fails to improve as anticipated.   Carron Curie, NP   Counseling Time: 46 mins Total Contact Time: 50 mins

## 2022-01-29 DIAGNOSIS — R69 Illness, unspecified: Secondary | ICD-10-CM | POA: Diagnosis not present

## 2022-02-02 ENCOUNTER — Other Ambulatory Visit: Payer: Self-pay | Admitting: Family

## 2022-02-05 DIAGNOSIS — R69 Illness, unspecified: Secondary | ICD-10-CM | POA: Diagnosis not present

## 2022-02-12 ENCOUNTER — Other Ambulatory Visit: Payer: Self-pay

## 2022-02-12 NOTE — Telephone Encounter (Signed)
Patient would like for [place] to call in Brand 70mg  Vyvanse

## 2022-02-14 ENCOUNTER — Other Ambulatory Visit: Payer: Self-pay

## 2022-02-14 MED ORDER — VYVANSE 70 MG PO CAPS: 70.0000 mg | ORAL_CAPSULE | Freq: Every day | ORAL | 0 refills | Status: DC

## 2022-02-14 MED ORDER — VYVANSE 70 MG PO CAPS: 70.0000 mg | ORAL_CAPSULE | ORAL | 0 refills | Status: DC

## 2022-02-14 NOTE — Telephone Encounter (Signed)
Prescriptions printed and faxed to pharmacy, brand name medically necessary.   Lorenz Coaster MD MPH

## 2022-02-14 NOTE — Telephone Encounter (Signed)
RX for above e-scribed and sent to pharmacy on record  [id number] [place], [place] - [address] [place] [address] Phone: [phone] Fax: [phone] 

## 2022-02-14 NOTE — Telephone Encounter (Signed)
RX was faxed instead of E-Scribed

## 2022-02-19 DIAGNOSIS — R69 Illness, unspecified: Secondary | ICD-10-CM | POA: Diagnosis not present

## 2022-02-28 ENCOUNTER — Encounter: Payer: Self-pay | Admitting: *Deleted

## 2022-03-03 ENCOUNTER — Other Ambulatory Visit: Payer: Self-pay

## 2022-03-04 MED ORDER — AMPHETAMINE SULFATE 10 MG PO TABS: 10.0000 mg | ORAL_TABLET | Freq: Every day | ORAL | 0 refills | Status: DC

## 2022-03-04 MED ORDER — VYVANSE 70 MG PO CAPS
70.0000 mg | ORAL_CAPSULE | ORAL | 0 refills | Status: DC
Start: 2022-03-04 — End: 2022-04-11

## 2022-03-04 NOTE — Telephone Encounter (Signed)
Evekeo 10 mg daily, in the afternoon, #30 with no RF's and Vyvanse 70 mg daily, #30 with no RF's.RX for above e-scribed and sent to pharmacy on record  [facility] [phone] [facility], [place] - [address] [place] [address] Phone: [phone] Fax: [phone]

## 2022-03-05 DIAGNOSIS — R69 Illness, unspecified: Secondary | ICD-10-CM | POA: Diagnosis not present

## 2022-03-19 DIAGNOSIS — R69 Illness, unspecified: Secondary | ICD-10-CM | POA: Diagnosis not present

## 2022-04-03 ENCOUNTER — Other Ambulatory Visit: Payer: Self-pay

## 2022-04-03 MED ORDER — AMPHETAMINE SULFATE 10 MG PO TABS: 10.0000 mg | ORAL_TABLET | Freq: Every day | ORAL | 0 refills | Status: DC

## 2022-04-03 NOTE — Telephone Encounter (Signed)
Evekeo 10 mg daily, #30 with no RF's. Grayce Sessions for above e-scribed and sent to pharmacy on record  [id number] [place], [place] [address] [place] [address] Phone: [phone] Fax: [phone]

## 2022-04-10 DIAGNOSIS — R69 Illness, unspecified: Secondary | ICD-10-CM | POA: Diagnosis not present

## 2022-04-11 ENCOUNTER — Other Ambulatory Visit: Payer: Self-pay

## 2022-04-11 MED ORDER — VYVANSE 70 MG PO CAPS: 70.0000 mg | ORAL_CAPSULE | Freq: Every day | ORAL | 0 refills | Status: AC

## 2022-04-11 MED ORDER — VYVANSE 70 MG PO CAPS: 70.0000 mg | ORAL_CAPSULE | ORAL | 0 refills | Status: AC

## 2022-04-11 MED ORDER — AMPHETAMINE SULFATE 10 MG PO TABS: 10.0000 mg | ORAL_TABLET | Freq: Every day | ORAL | 0 refills | Status: DC

## 2022-04-11 MED ORDER — GUANFACINE HCL ER 1 MG PO TB24: 1.0000 mg | ORAL_TABLET | Freq: Every day | ORAL | 2 refills | Status: AC

## 2022-04-11 MED ORDER — VYVANSE 70 MG PO CAPS: 70.0000 mg | ORAL_CAPSULE | Freq: Every day | ORAL | 0 refills | Status: DC

## 2022-04-11 NOTE — Telephone Encounter (Signed)
Vyvanse 70 mg daily, #30 with no RF's and 2 other Rx's for Vyvanse 70 mg post dated for February and March. Evekeo 10 mg #30 with no RF's and Intuniv 1 mg daily, #90 with 2 RF's.RX for above e-scribed and sent to pharmacy on record  [id number] [place], [place] [address] [place] [address] Phone: [phone] Fax: [phone]

## 2022-04-22 ENCOUNTER — Telehealth: Payer: 59 | Admitting: Family

## 2022-04-23 ENCOUNTER — Encounter: Payer: 59 | Admitting: Family

## 2022-04-25 NOTE — Progress Notes (Signed)
This encounter was created in error - please disregard.

## 2022-05-02 ENCOUNTER — Telehealth: Payer: Self-pay | Admitting: Family

## 2022-05-02 MED ORDER — AMPHETAMINE SULFATE 10 MG PO TABS: 10.0000 mg | ORAL_TABLET | Freq: Every day | ORAL | 0 refills | Status: DC

## 2022-05-02 NOTE — Telephone Encounter (Signed)
Evekeo 10 mg daily, #30 with no RF's. Post dated 2 Rx's for 05/31/22 & 07/01/22. RX for above e-scribed and sent to pharmacy on record  [id number] [place]: [phone]: [phone]

## 2022-05-03 ENCOUNTER — Telehealth: Payer: Self-pay

## 2022-05-03 NOTE — Telephone Encounter (Signed)
  Name of who is calling: Fern  Caller's Relationship to Patient: self  Best contact number: [phone]  Provider they see:   Reason for call: calling to request refill but didn't mention name of medication, pharmacy has tried to make contact as well but hasn't been able to reach anyone to get her refills done     [place]  Name of prescription:  Pharmacy:

## 2022-05-03 NOTE — Telephone Encounter (Signed)
Call back to [facility] left message she needs to call our office back with which medication she needs refilled. Advised [facility] For theVyvanse, Evekeo and Intuniv so she should not need any refills unless she is going to a different pharm. If [facility] denies [facility] them call our office back on Mon.

## 2022-05-06 ENCOUNTER — Telehealth (INDEPENDENT_AMBULATORY_CARE_PROVIDER_SITE_OTHER): Payer: Self-pay

## 2022-05-06 DIAGNOSIS — F439 Reaction to severe stress, unspecified: Secondary | ICD-10-CM | POA: Diagnosis not present

## 2022-05-06 NOTE — Telephone Encounter (Signed)
Called patient and left a message regarding insurance coverage. Suggested patient contact insurance for approved medications and call back with information for change to her Evekeo.

## 2022-05-06 NOTE — Telephone Encounter (Signed)
  Name of who is calling:Cassie [name]   Caller's Relationship to Patient:self   Best contact number:[phone]  Provider they DO:[id number]   Reason for call:medication refill, no longer approved by insurance and needing a alternate medication sent in.      PRESCRIPTION REFILL ONLY  Name of prescription:Amphetamine Sulfate   [facility] [place] [place]

## 2022-05-07 MED ORDER — AMPHETAMINE-DEXTROAMPHETAMINE 10 MG PO TABS: 10.0000 mg | ORAL_TABLET | Freq: Every evening | ORAL | 0 refills | Status: AC

## 2022-05-07 NOTE — Addendum Note (Signed)
Addended by: Carolann Littler on: 05/07/2022 01:45 PM   Modules accepted: Orders

## 2022-05-07 NOTE — Telephone Encounter (Signed)
Form requesting alternate medication for Amphetamine Sulfate 10 mg be prescribed- suggested Zenzedi or Dextroamphetamine per insurance. NP ordered amphetamine-dextroamphetamine on 05/07/2022  RN pulled up the preferred drug list  Left message for pharmacy to return RN call to confirm the new rx is approved

## 2022-05-07 NOTE — Telephone Encounter (Signed)
Insurance to cover generic Adderall IR. To discontinue Evekeo due to insurance not covering medication. Will try Adderall 10 mg daily in the afternoon, #30 with no RF's.RX for above e-scribed and sent to pharmacy on record  [id number] [place]Phone: [phone]: [phone]

## 2022-05-13 ENCOUNTER — Other Ambulatory Visit (HOSPITAL_BASED_OUTPATIENT_CLINIC_OR_DEPARTMENT_OTHER): Payer: Self-pay

## 2022-05-13 ENCOUNTER — Telehealth: Payer: Self-pay | Admitting: Family

## 2022-05-13 ENCOUNTER — Telehealth (INDEPENDENT_AMBULATORY_CARE_PROVIDER_SITE_OTHER): Payer: Self-pay

## 2022-05-13 NOTE — Telephone Encounter (Signed)
  Name of who is calling:Kameko   Caller's Relationship to Patient:self   Best contact number:[phone]  Provider they see:[facility]  Reason for call:needs medication sent to another pharmacy. Current pharmacy are out of stock of the Vyvanse. She [facility] [place]  Name of prescription:Vyvanse 70MG   [facility]

## 2022-05-13 NOTE — Telephone Encounter (Unsigned)
Patient's Vyvanse is not available at the current pharmacy and [facility]. Vyvanse 70 mg daily, #30 with no RF's.RX for above e-scribed and sent to pharmacy on record  [facility] [address] [place] [address] [place] [address] Phone: [phone] Fax: [phone]

## 2022-05-14 ENCOUNTER — Other Ambulatory Visit (HOSPITAL_BASED_OUTPATIENT_CLINIC_OR_DEPARTMENT_OTHER): Payer: Self-pay

## 2022-05-14 MED ORDER — VYVANSE 70 MG PO CAPS
70.0000 mg | ORAL_CAPSULE | Freq: Every day | ORAL | 0 refills | Status: DC
  Filled 2022-05-14: qty 30, 30d supply, fill #0

## 2022-06-05 DIAGNOSIS — F439 Reaction to severe stress, unspecified: Secondary | ICD-10-CM | POA: Diagnosis not present

## 2022-06-11 ENCOUNTER — Other Ambulatory Visit (HOSPITAL_BASED_OUTPATIENT_CLINIC_OR_DEPARTMENT_OTHER): Payer: Self-pay

## 2022-06-11 ENCOUNTER — Other Ambulatory Visit: Payer: Self-pay

## 2022-06-11 MED ORDER — LISDEXAMFETAMINE DIMESYLATE 70 MG PO CAPS
70.0000 mg | ORAL_CAPSULE | Freq: Every day | ORAL | 0 refills | Status: DC
  Filled 2022-06-11: qty 30, 30d supply, fill #0

## 2022-06-12 ENCOUNTER — Other Ambulatory Visit (HOSPITAL_BASED_OUTPATIENT_CLINIC_OR_DEPARTMENT_OTHER): Payer: Self-pay

## 2022-06-19 DIAGNOSIS — F439 Reaction to severe stress, unspecified: Secondary | ICD-10-CM | POA: Diagnosis not present

## 2022-06-24 ENCOUNTER — Other Ambulatory Visit (HOSPITAL_BASED_OUTPATIENT_CLINIC_OR_DEPARTMENT_OTHER): Payer: Self-pay

## 2022-06-24 MED ORDER — AMPHETAMINE-DEXTROAMPHETAMINE 10 MG PO TABS
20.0000 mg | ORAL_TABLET | Freq: Every day | ORAL | 0 refills | Status: DC
  Filled 2022-06-24: qty 60, 30d supply, fill #0

## 2022-06-25 ENCOUNTER — Other Ambulatory Visit (HOSPITAL_BASED_OUTPATIENT_CLINIC_OR_DEPARTMENT_OTHER): Payer: Self-pay

## 2022-07-02 DIAGNOSIS — F439 Reaction to severe stress, unspecified: Secondary | ICD-10-CM | POA: Diagnosis not present

## 2022-07-11 ENCOUNTER — Other Ambulatory Visit (HOSPITAL_BASED_OUTPATIENT_CLINIC_OR_DEPARTMENT_OTHER): Payer: Self-pay

## 2022-07-11 ENCOUNTER — Other Ambulatory Visit (HOSPITAL_COMMUNITY): Payer: Self-pay

## 2022-07-11 MED ORDER — LISDEXAMFETAMINE DIMESYLATE 70 MG PO CAPS
70.0000 mg | ORAL_CAPSULE | Freq: Every day | ORAL | 0 refills | Status: DC
  Filled 2022-07-11 – 2022-08-09 (×3): qty 30, 30d supply, fill #0

## 2022-07-22 ENCOUNTER — Other Ambulatory Visit (HOSPITAL_BASED_OUTPATIENT_CLINIC_OR_DEPARTMENT_OTHER): Payer: Self-pay

## 2022-07-27 ENCOUNTER — Other Ambulatory Visit (HOSPITAL_BASED_OUTPATIENT_CLINIC_OR_DEPARTMENT_OTHER): Payer: Self-pay

## 2022-07-27 MED ORDER — AMPHETAMINE-DEXTROAMPHETAMINE 10 MG PO TABS
20.0000 mg | ORAL_TABLET | Freq: Every day | ORAL | 0 refills | Status: DC
  Filled 2022-07-27: qty 60, 30d supply, fill #0

## 2022-07-30 ENCOUNTER — Institutional Professional Consult (permissible substitution): Payer: 59 | Admitting: Family

## 2022-07-30 DIAGNOSIS — F439 Reaction to severe stress, unspecified: Secondary | ICD-10-CM | POA: Diagnosis not present

## 2022-08-02 ENCOUNTER — Other Ambulatory Visit (HOSPITAL_BASED_OUTPATIENT_CLINIC_OR_DEPARTMENT_OTHER): Payer: Self-pay

## 2022-08-02 ENCOUNTER — Other Ambulatory Visit: Payer: Self-pay

## 2022-08-09 ENCOUNTER — Other Ambulatory Visit (HOSPITAL_BASED_OUTPATIENT_CLINIC_OR_DEPARTMENT_OTHER): Payer: Self-pay

## 2022-08-10 ENCOUNTER — Other Ambulatory Visit (HOSPITAL_BASED_OUTPATIENT_CLINIC_OR_DEPARTMENT_OTHER): Payer: Self-pay

## 2022-08-20 DIAGNOSIS — F439 Reaction to severe stress, unspecified: Secondary | ICD-10-CM | POA: Diagnosis not present

## 2022-09-06 ENCOUNTER — Other Ambulatory Visit (HOSPITAL_BASED_OUTPATIENT_CLINIC_OR_DEPARTMENT_OTHER): Payer: Self-pay

## 2022-09-06 MED ORDER — AMPHETAMINE-DEXTROAMPHETAMINE 10 MG PO TABS
20.0000 mg | ORAL_TABLET | Freq: Every day | ORAL | 0 refills | Status: AC
  Filled 2022-09-06: qty 60, 30d supply, fill #0

## 2022-09-07 ENCOUNTER — Other Ambulatory Visit (HOSPITAL_BASED_OUTPATIENT_CLINIC_OR_DEPARTMENT_OTHER): Payer: Self-pay

## 2022-09-10 DIAGNOSIS — F439 Reaction to severe stress, unspecified: Secondary | ICD-10-CM | POA: Diagnosis not present

## 2022-09-12 ENCOUNTER — Other Ambulatory Visit (HOSPITAL_BASED_OUTPATIENT_CLINIC_OR_DEPARTMENT_OTHER): Payer: Self-pay

## 2022-09-13 ENCOUNTER — Other Ambulatory Visit (HOSPITAL_BASED_OUTPATIENT_CLINIC_OR_DEPARTMENT_OTHER): Payer: Self-pay

## 2022-09-13 MED ORDER — LISDEXAMFETAMINE DIMESYLATE 70 MG PO CAPS
70.0000 mg | ORAL_CAPSULE | Freq: Every day | ORAL | 0 refills | Status: AC
  Filled 2022-09-13: qty 30, 30d supply, fill #0

## 2022-09-24 DIAGNOSIS — F439 Reaction to severe stress, unspecified: Secondary | ICD-10-CM | POA: Diagnosis not present

## 2022-10-08 DIAGNOSIS — F439 Reaction to severe stress, unspecified: Secondary | ICD-10-CM | POA: Diagnosis not present

## 2022-10-21 ENCOUNTER — Telehealth: Payer: 59 | Admitting: Family

## 2022-10-22 DIAGNOSIS — F439 Reaction to severe stress, unspecified: Secondary | ICD-10-CM | POA: Diagnosis not present

## 2022-11-11 DIAGNOSIS — Z01411 Encounter for gynecological examination (general) (routine) with abnormal findings: Secondary | ICD-10-CM | POA: Diagnosis not present

## 2022-11-11 DIAGNOSIS — Z01419 Encounter for gynecological examination (general) (routine) without abnormal findings: Secondary | ICD-10-CM | POA: Diagnosis not present

## 2022-11-11 DIAGNOSIS — Z1389 Encounter for screening for other disorder: Secondary | ICD-10-CM | POA: Diagnosis not present

## 2022-11-11 DIAGNOSIS — Z3040 Encounter for surveillance of contraceptives, unspecified: Secondary | ICD-10-CM | POA: Diagnosis not present

## 2022-11-11 DIAGNOSIS — L293 Anogenital pruritus, unspecified: Secondary | ICD-10-CM | POA: Diagnosis not present

## 2022-11-11 DIAGNOSIS — B3731 Acute candidiasis of vulva and vagina: Secondary | ICD-10-CM | POA: Diagnosis not present

## 2022-11-11 DIAGNOSIS — N920 Excessive and frequent menstruation with regular cycle: Secondary | ICD-10-CM | POA: Diagnosis not present

## 2022-11-11 DIAGNOSIS — N898 Other specified noninflammatory disorders of vagina: Secondary | ICD-10-CM | POA: Diagnosis not present

## 2022-11-11 DIAGNOSIS — Z113 Encounter for screening for infections with a predominantly sexual mode of transmission: Secondary | ICD-10-CM | POA: Diagnosis not present

## 2022-11-20 DIAGNOSIS — F439 Reaction to severe stress, unspecified: Secondary | ICD-10-CM | POA: Diagnosis not present

## 2022-12-18 DIAGNOSIS — F439 Reaction to severe stress, unspecified: Secondary | ICD-10-CM | POA: Diagnosis not present

## 2023-01-28 ENCOUNTER — Institutional Professional Consult (permissible substitution): Payer: 59 | Admitting: Family

## 2023-02-19 DIAGNOSIS — F439 Reaction to severe stress, unspecified: Secondary | ICD-10-CM | POA: Diagnosis not present

## 2023-04-02 DIAGNOSIS — F4322 Adjustment disorder with anxiety: Secondary | ICD-10-CM | POA: Diagnosis not present

## 2023-04-16 DIAGNOSIS — F4322 Adjustment disorder with anxiety: Secondary | ICD-10-CM | POA: Diagnosis not present

## 2023-05-14 DIAGNOSIS — F4322 Adjustment disorder with anxiety: Secondary | ICD-10-CM | POA: Diagnosis not present

## 2023-06-04 DIAGNOSIS — F4322 Adjustment disorder with anxiety: Secondary | ICD-10-CM | POA: Diagnosis not present

## 2023-06-24 DIAGNOSIS — F4322 Adjustment disorder with anxiety: Secondary | ICD-10-CM | POA: Diagnosis not present

## 2023-07-22 DIAGNOSIS — F4322 Adjustment disorder with anxiety: Secondary | ICD-10-CM | POA: Diagnosis not present

## 2023-08-25 DIAGNOSIS — Z3009 Encounter for other general counseling and advice on contraception: Secondary | ICD-10-CM | POA: Diagnosis not present

## 2023-08-27 DIAGNOSIS — F4322 Adjustment disorder with anxiety: Secondary | ICD-10-CM | POA: Diagnosis not present

## 2023-09-23 DIAGNOSIS — F4322 Adjustment disorder with anxiety: Secondary | ICD-10-CM | POA: Diagnosis not present

## 2023-09-30 DIAGNOSIS — F4322 Adjustment disorder with anxiety: Secondary | ICD-10-CM | POA: Diagnosis not present

## 2023-10-22 DIAGNOSIS — F4322 Adjustment disorder with anxiety: Secondary | ICD-10-CM | POA: Diagnosis not present

## 2023-11-18 DIAGNOSIS — F4322 Adjustment disorder with anxiety: Secondary | ICD-10-CM | POA: Diagnosis not present

## 2023-12-08 DIAGNOSIS — F4322 Adjustment disorder with anxiety: Secondary | ICD-10-CM | POA: Diagnosis not present

## 2024-01-26 DIAGNOSIS — F4322 Adjustment disorder with anxiety: Secondary | ICD-10-CM | POA: Diagnosis not present

## 2024-02-09 DIAGNOSIS — F4322 Adjustment disorder with anxiety: Secondary | ICD-10-CM | POA: Diagnosis not present

## 2024-03-01 DIAGNOSIS — F4322 Adjustment disorder with anxiety: Secondary | ICD-10-CM | POA: Diagnosis not present
